# Patient Record
Sex: Male | Born: 1945 | Race: White | Hispanic: No | Smoking: Former smoker
Health system: Southern US, Community
[De-identification: ages and names within clinical notes are randomized; demographics above are authoritative.]

## PROBLEM LIST (undated history)

## (undated) DIAGNOSIS — E782 Mixed hyperlipidemia: Secondary | ICD-10-CM

## (undated) DIAGNOSIS — J449 Chronic obstructive pulmonary disease, unspecified: Secondary | ICD-10-CM

## (undated) DIAGNOSIS — G4733 Obstructive sleep apnea (adult) (pediatric): Secondary | ICD-10-CM

## (undated) DIAGNOSIS — I10 Essential (primary) hypertension: Secondary | ICD-10-CM

## (undated) DIAGNOSIS — R7303 Prediabetes: Secondary | ICD-10-CM

## (undated) DIAGNOSIS — G4730 Sleep apnea, unspecified: Secondary | ICD-10-CM

## (undated) HISTORY — DX: Essential (primary) hypertension: I10

## (undated) HISTORY — DX: Mixed hyperlipidemia: E78.2

## (undated) HISTORY — DX: Chronic obstructive pulmonary disease, unspecified: J44.9

## (undated) HISTORY — DX: Prediabetes: R73.03

## (undated) HISTORY — DX: Obstructive sleep apnea (adult) (pediatric): G47.33

---

## 2000-09-29 HISTORY — PX: OTHER SURGICAL HISTORY: SHX169

## 2002-09-06 ENCOUNTER — Encounter: Payer: Self-pay | Admitting: Orthopedic Surgery

## 2002-09-12 ENCOUNTER — Encounter: Payer: Self-pay | Admitting: Orthopedic Surgery

## 2002-09-12 ENCOUNTER — Inpatient Hospital Stay (HOSPITAL_COMMUNITY): Admission: RE | Admit: 2002-09-12 | Discharge: 2002-09-15 | Payer: Self-pay | Admitting: Orthopedic Surgery

## 2007-12-21 ENCOUNTER — Ambulatory Visit (HOSPITAL_COMMUNITY): Admission: RE | Admit: 2007-12-21 | Discharge: 2007-12-21 | Payer: Self-pay | Admitting: Internal Medicine

## 2008-01-03 ENCOUNTER — Encounter (HOSPITAL_COMMUNITY): Admission: RE | Admit: 2008-01-03 | Discharge: 2008-02-02 | Payer: Self-pay | Admitting: Internal Medicine

## 2008-12-04 ENCOUNTER — Encounter (INDEPENDENT_AMBULATORY_CARE_PROVIDER_SITE_OTHER): Payer: Self-pay | Admitting: *Deleted

## 2010-01-16 ENCOUNTER — Telehealth: Payer: Self-pay | Admitting: Internal Medicine

## 2010-10-29 NOTE — Progress Notes (Signed)
Summary: Schedule Colonoscopy  Phone Note Outgoing Call Call back at [facility] Phone [phone]   Call placed by: Harlow Mares CMA Duncan Dull),  January 16, 2010 3:24 PM Call placed to: Patient Summary of Call: Left message on patients machine to call back. patient is due for his colonoscopy. Initial call taken by: Harlow Mares CMA Duncan Dull),  January 16, 2010 3:24 PM  Follow-up for Phone Call        scheduled for 02/13/2010. Follow-up by: Harlow Mares CMA Duncan Dull),  Jan 28, 2010 10:54 AM

## 2010-12-19 ENCOUNTER — Ambulatory Visit (HOSPITAL_COMMUNITY)
Admission: RE | Admit: 2010-12-19 | Discharge: 2010-12-19 | Disposition: A | Discharge status: Home / Self Care | Payer: 59 | Source: Ambulatory Visit | Attending: Internal Medicine | Admitting: Internal Medicine

## 2010-12-19 ENCOUNTER — Other Ambulatory Visit (INDEPENDENT_AMBULATORY_CARE_PROVIDER_SITE_OTHER): Payer: Self-pay | Admitting: Internal Medicine

## 2010-12-19 ENCOUNTER — Encounter (HOSPITAL_BASED_OUTPATIENT_CLINIC_OR_DEPARTMENT_OTHER): Payer: 59 | Admitting: Internal Medicine

## 2010-12-19 DIAGNOSIS — K921 Melena: Secondary | ICD-10-CM

## 2010-12-19 DIAGNOSIS — K5730 Diverticulosis of large intestine without perforation or abscess without bleeding: Secondary | ICD-10-CM

## 2010-12-19 DIAGNOSIS — K648 Other hemorrhoids: Secondary | ICD-10-CM | POA: Insufficient documentation

## 2010-12-19 DIAGNOSIS — D126 Benign neoplasm of colon, unspecified: Secondary | ICD-10-CM | POA: Insufficient documentation

## 2010-12-19 DIAGNOSIS — K644 Residual hemorrhoidal skin tags: Secondary | ICD-10-CM

## 2010-12-19 DIAGNOSIS — I10 Essential (primary) hypertension: Secondary | ICD-10-CM | POA: Insufficient documentation

## 2010-12-19 DIAGNOSIS — Z79899 Other long term (current) drug therapy: Secondary | ICD-10-CM | POA: Insufficient documentation

## 2010-12-31 NOTE — Op Note (Signed)
  Brendan Ramirez, Brendan Ramirez NO.:  [id number]  MEDICAL RECORD NO.:  [id number]           PATIENT TYPE:  O  LOCATION:  DAYP                          FACILITY:  [facility]  PHYSICIAN:  Lionel December, M.D.    DATE OF BIRTH:  06/14/1946  DATE OF PROCEDURE:  12/19/2010 DATE OF DISCHARGE:                              OPERATIVE REPORT   PROCEDURE:  Colonoscopy.  INDICATION:  Brendan Ramirez is a 65 year old Caucasian male who has been having intermittent hematochezia.  He is undergoing diagnostic colonoscopy. His last exam was possibly 6 years ago in [place].  He decided to have this test locally.  Family history is significant for colon carcinoma in his grandmother but she was 54 at the time of diagnosis. Procedure risks were reviewed with the patient and informed consent was obtained.  MEDICATIONS FOR CONSCIOUS SEDATION:  Demerol 50 mg IV, Versed 4 mg IV.  FINDINGS:  Procedure performed in endoscopy suite.  The patient's vitalsigns and O2 sat were monitored during the procedure and remained stable.  The patient was placed in left lateral recumbent position. Rectal examination performed.  No abnormality noted on external or digital exam.  Pentax videoscope was placed through rectum and advanced under vision into sigmoid colon beyond.  Preparation was excellent. Single small diverticula was noted at distal sigmoid colon.  Scope was passed into cecum which was identified by appendiceal orifice and ileocecal valve.  Pictures were taken for the record.  As the scope was withdrawn, colonic mucosa was carefully examined.  There was 2-mm polyp at descending colon which was ablated via cold biopsy.  Mucosa, rest of the colon was normal.  Rectal mucosa similarly was normal.  Scope was retroflexed to examine anorectal junction.  Small hemorrhoids noted below the dentate line.  Endoscope was then withdrawn.  Withdrawal time was 15 minutes.  The patient tolerated the procedure well.  FINAL  DIAGNOSIS:  Examination performed to cecum.  A 2-mm polyp ablated via cold biopsy from descending colon.  Single diverticulum at sigmoid colon and external hemorrhoids.  RECOMMENDATIONS:  Standard instructions given.  I will be contacting the patient with the results of biopsy and further recommendations.     Lionel December, M.D.     NR/MEDQ  D:  12/19/2010  T:  12/19/2010  Job:  [id number]  cc:   Madelin Rear. Sherwood Gambler, MD Fax: [phone]  Electronically Signed by Lionel December M.D. on 12/31/2010 01:54:58 PM

## 2011-02-14 NOTE — Op Note (Signed)
Brendan Ramirez, Brendan Ramirez NO.:  [id number]   MEDICAL RECORD NO.:  [id number]                   PATIENT TYPE:  INP   LOCATION:  0010                                 FACILITY:  [facility]   PHYSICIAN:  Ollen Gross, M.D.                 DATE OF BIRTH:  Feb 04, 1946   DATE OF PROCEDURE:  09/12/2002  DATE OF DISCHARGE:                                 OPERATIVE REPORT   PREOPERATIVE DIAGNOSES:  Osteoarthritis, left hip.   POSTOPERATIVE DIAGNOSES:  Osteoarthritis, left hip.   PROCEDURE:  Left total hip arthroplasty.   SURGEON:  Ollen Gross, M.D.   ASSISTANT:  Avel Peace, P.A.-C.   ANESTHESIA:  General.   ESTIMATED BLOOD LOSS:  300   DRAINS:  Hemovac x1.   COMPLICATIONS:  None.   CONDITION:  Stable to recovery.   BRIEF CLINICAL NOTE:  Brendan Ramirez is a 65 year old man with severe end-stage  osteoarthritis of the left hip with pain refractory to nonoperative  management. He presents now for left total hip arthroplasty.   DESCRIPTION OF PROCEDURE:  After successful administration of general  anesthetic, the patient was placed in the right lateral decubitus position  with the left side up and held with a hip positioner. The left lower  extremity was isolated from his perineum with plastic drapes and prepped and  draped in the usual sterile fashion. A standard posterolateral incision was  made with a 10 blade through the subcutaneous tissue to the level of the  fascia lata which was incised in line with the skin incision. The sciatic  nerve was palpated and protected and short rotators isolated off the femur.  Capsulectomy was performed and the hip dislocated. The center of the femoral  head was marked and the trial prosthesis placed such that the center of the  trial head corresponds to the center of the native femoral head. Osteotomy  is made down the femoral neck with an oscillating saw and the femoral head  removed. The femur is retracted anteriorly  and acetabular exposure obtained.   The labrum is removed and then osteophytes also removed. Acetabular reaming  started at 51 coursing in increments of 2 to a 57 and then a 58 mm pinnacle  acetabular shell is placed in anatomic position and transfixed with two dome  screws. A trial 32 mm neutral liner is placed.   The femur is addressed first with the canal finder and irrigation. Axial  reaming is performed up to 15.5 mm, proximal reaming up to a 20D and the  sleeve machined to a large. The 20D large trial sleeve is placed with a 20 x  15 stem and a 36 plus 8 neck. We had to drill about 5-10 degrees behind his  native anteversion to get normal version. The 33 plus zero head is placed,  hip reduced with excellent stability, full extension, full external  rotation,  70 degrees flexion, 40 degrees adduction, and 90 degrees internal  rotation and then 90 degrees flexion and 70 degrees internal rotation. The  hip is dislocated and all trials removed. A permanent apex hole eliminator  is placed into the acetabular shell and then a permanent 32 mm neutral  marathon liner is placed. A permanent 20D large sleeve is placed in the  proximally femur and then a 20 x 15 stem with a 36 plus 8 neck placed  matching the same anteversion as the trial. A permanent 32 plus zero head is  placed, hip reduced with the same stability parameters. The wound was  copiously irrigated with antibiotic solution and short rotators reattached  to the femur through drill holes. The fascia lata was closed over a Hemovac  drain with interrupted #1 Vicryl, subcu closed with #1 and 2-0 Vicryl and  subcuticular with running 4-0 Monocryl. The incision was cleaned and dried  and Steri-Strips and a bulky sterile dressing applied. The drain was hooked  to suction. H was placed in a knee immobilizer, awakened and transported to  recovery in stable condition.                                               Ollen Gross, M.D.     FA/MEDQ  D:  09/12/2002  T:  09/12/2002  Job:  [id number]

## 2011-02-14 NOTE — H&P (Signed)
NAMEJAXON, Brendan Ramirez NO.:  [id number]   MEDICAL RECORD NO.:  [id number]                   PATIENT TYPE:  INP   LOCATION:  0462                                 [facility]AC[facility]   PHYSICIAN:  Ollen Gross, M.D.                 DATE OF BIRTH:  [birth year]   DATE OF ADMISSION:  09/12/2002  DATE OF DISCHARGE:  09/15/2002                                HISTORY & PHYSICAL   CHIEF COMPLAINT:  Left hip pain.   HISTORY OF PRESENT ILLNESS:  The patient is a 65 year old male with a four  to five year history of left hip discomfort and pain that has been  progressive over the past several months.  He has been seen and evaluated by  Dr. Lequita Halt.  He states now that he is in constant pain.  It is mainly the  lateral hip with some groin pain and buttock pain.  It started to effect his  gait and ambulation.  He denies any paresthesias into the lower extremities.  He has also started to experience pain at night.  It started to interfere  with his work and also his activities of daily living.  He is seen in the  office where x-rays reveal severe bone-on-bone changes, large osteophyte  formation with marginal subluxation of the hip, and inferior osteophyte  formation at the femoral neck and acetabulum, and essentially bone-on-bone  throughout.  It is felt he would benefit from undergoing a total hip  replacement.  Risks and benefits of the procedure have been discussed with  the patient.  He has elected to proceed with surgery.   ALLERGIES:  SULFA, unknown reaction during his infant years.   MEDICATIONS:  Acetaminophen three tablets t.i.d.   PAST MEDICAL HISTORY:  1. Hemorrhoids.  2. Arthritis.   PAST SURGICAL HISTORY:  Tonsillectomy in 1957.   SOCIAL HISTORY:  Married.  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  Nonsmoker at this time, he quit 18 years ago.  He did  smoke for about four to five years.  No intake of alcohol.  He has  five  children.  He has a two story home with 14 steps.   FAMILY HISTORY:  Mother with hypertension.  Grandmother deceased with a  history of diabetes.  He has a brother with Hodgkin's disease.   REVIEW OF SYMPTOMS:  GENERAL:  No fevers, chills, or night sweats.  NEUROLOGIC:  No seizures, syncope, or paralysis.  RESPIRATORY:  No shortness  of breath, productive cough, or hemoptysis.  CARDIOVASCULAR:  No chest pain,  angina, or orthopnea.  GASTROINTESTINAL:  No nausea, vomiting, diarrhea,  constipation, no blood or mucus in the stool.  GENITOURINARY:  No dysuria,  hematuria, discharge.  MUSCULOSKELETAL:  Pertinent to that of left hip found  in the history of present illness.   PHYSICAL EXAMINATION:  VITAL  SIGNS:  Pulse 60, respirations 12, blood  pressure 148/88.  GENERAL:  The patient is a 65 year old white male, well-developed, well-  nourished, appears to be in no acute distress. slightly overweight.  HEENT:  Normocephalic, atraumatic.  Pupils are round and reactive.  Oropharynx is clear.  NECK:  Supple.  CHEST:  Clear to auscultation anterior and posterior chest walls, no  wheezes, rhonchi, or rales.  HEART:  Regular rate and rhythm, no murmur, S1 and S2 noted.  ABDOMEN:  Soft, nontender, bowel sounds present, slightly round.  RECTAL:  Not done, not pertinent to present illness.  BREASTS:  Not done, not pertinent to present illness.  GENITALIA:  Not done, not pertinent to present illness.  EXTREMITIES:  Significant to that of the left lower extremity.  The left leg  is approximately 1/4 inch shorter as compared to the right leg.  Range of  motion with the left hip approximately 85 to 90 degrees of flexion.  There  is no internal or no external rotation.  Only 20 degrees of abduction.  Motor function is intact.   IMPRESSION:  1. Osteoarthritis of the left hip.  2. Hemorrhoids.   PLAN:  The patient will be admitted to [facility] to undergo a  left total hip  replacement arthroplasty.  Surgery will be performed by Dr.  Ollen Gross.      Alexzandrew L. Julien Girt, P.A.              Ollen Gross, M.D.    ALP/MEDQ  D:  09/15/2002  T:  09/16/2002  Job:  [id number]

## 2011-02-14 NOTE — Discharge Summary (Signed)
NAMEJAHMEIR, Brendan Ramirez NO.:  [id number]   MEDICAL RECORD NO.:  [id number]                   PATIENT TYPE:  INP   LOCATION:  0462                                 FACILITY:  [facility]   PHYSICIAN:  Ollen Gross, M.D.                 DATE OF BIRTH:  October 02, 1945   DATE OF ADMISSION:  09/12/2002  DATE OF DISCHARGE:  09/15/2002                                 DISCHARGE SUMMARY   ADMISSION DIAGNOSES:  1. Osteoarthritis, left hip.  2. Hemorrhoids.  3. Borderline hypertension.   DISCHARGE DIAGNOSES:  1. Osteoarthritis, left hip, status post left total hip replacement with     arthroplasty.  2. Hemorrhoids.  3. Borderline hypertension.   PROCEDURE:  The patient was taken to the OR, September 12, 2002, and  underwent a left total hip replacement with arthroplasty.   SURGEON:  Ollen Gross, M.D.   ASSISTANT:  Alexzandrew L. Julien Girt, P.A.   ANESTHESIA:  General anesthesia.   ESTIMATED BLOOD LOSS:  300 cc.  Hemovac drain x1.   CONSULTATIONS:  Medical, Dr. Wylene Simmer.   BRIEF HISTORY:  The patient is a 65 year old male with a four- to five-year  history of left hip discomfort that has been progressive for the past  several months, evaluated by Dr. Lequita Halt, and is now in constant pain.  X-  rays in the office show severe bone and bone changes with large osteophyte  formation, moderate subluxation of the hip.  It was felt he had reached the  point where he could benefit by undergoing a total hip replacement.  Risks  and benefits discussed.  The patient was subsequently admitted to the  hospital.   LABORATORY DATA:  Hemoglobin 14.6, hematocrit 42.5, white cell count 6.3,  red cell count 4.70, differential all within normal limits.  Postoperative  H&H 12.4 and 35.2.  Last night, H&H 11.2 and 31.7.  PT and PTT on admission  were 12.5 and 31, respectively, with an INR of 0.9.  Serial protimes  followed per Coumadin protocol.  Last noted PT/INR, 18.8 and 1.7.   Chem  panel on admission all within normal limits.  Sodium did have a drop from  141 to 138.  Was last noted to have 133.  CO2 went up from 28 to 33, back  down to 30.  Calcium dropped from 10.0 to 8.3.  The patient did have an  elevated ALP on admission chem panel, 290.  That was the only abnormality.  Urinalysis on September 06, 2002, was negative.  Blood group type A positive.  Preoperative left hip films showed osteoarthritis of the left hip.  Postoperative hip films showed left total hip arthroplasty with __________  and no acute complications.   HOSPITAL COURSE:  The patient was admitted to [facility] and taken  to OR.  Underwent above-stated procedure without complications.  The patient  tolerated the procedure well,  was taken to the recovery room, then to the  orthopedic floor for continued postoperative care.  The patient was given 24  hours of post IV antibiotics plus some PCA analgesics in the form of  morphine.  Hemovac drain placed at the time of surgery was pulled on  postoperative day #1.  The patient's medical doctor is Dr. Wylene Simmer.  Dr.  Wylene Simmer saw the patient on postoperative day #1 and was available for any  medical concerns on the patient throughout the hospital course.  The patient  did well during the course.  PT and OT was consulted to assist with gait  training ambulation.  The patient did well with physical therapy.  He was up  and ambulating approximately 80 feet by postoperative day #2 and by 160 feet  by that evening.  Dressing changes were initiated on postoperative day #2.  The incision was healing well.  PCAs and IVs were discontinued on  postoperative day #2.  He was weaned over the p.o. medications.  He was  having much less pain.  By day #3, the patient was doing quite well.  Had  less pain, tolerating this.  Was discharged home.   DISCHARGE MEDICATIONS/PLAN:  1. Patient discharged home on September 15, 2002.  2. Discharge diagnosis, please see  above.  3. Discharge medications:  Percocet for pain, Robaxin for spasm, Coumadin as     per pharmacy protocol.  4. Diet as tolerated.  5. Activity:  Touchdown weightbearing.  Home health PT and home health     nursing through [facility].  Hip precautions at all times.   FOLLOW UP:  Tuesday, September 27, 2002.  Call for an appointment at [phone]  [phone].   DISPOSITION:  Home.   CONDITION ON DISCHARGE:  Improved.     Alexzandrew L. Julien Girt, P.A.              Ollen Gross, M.D.    ALP/MEDQ  D:  10/12/2002  T:  10/12/2002  Job:  [id number]   cc:   Gaspar Garbe, M.D.  [address]  [place]  [place] [address]  Fax: [phone]

## 2011-02-14 NOTE — Consult Note (Signed)
NAMEMERRIC, YOST NO.:  [id number]   MEDICAL RECORD NO.:  [id number]                   PATIENT TYPE:  INP   LOCATION:  0462                                 [facility]ACILITY[facility]  [facility]   PHYSICIAN:  Gaspar Garbe, M.D.            DATE OF BIRTH:  29-Sep-1946   DATE OF CONSULTATION:  09/13/2002  DATE OF DISCHARGE:                                   CONSULTATION   REASON FOR CONSULTATION:  General medical care status post left hip  replacement.   HISTORY OF PRESENT ILLNESS:  The patient is a 66 year old white male first  seen by me on referral by Dr. Ollen Gross.  On August 30, 2002, he was  referred for preoperative clearance for his upcoming left hip replacement.  At that time the patient had no medical M.D. for approximately 20 years.  His exam was normal.  He was showing some slight borderline hypertension and  slight dyslipidemia with slightly elevated triglycerides, decreased HDL, but  with an excellently controlled LDL of 89 and was found to be at low risk.  The patient has had his left hip replacement on September 12, 2002.  He is  currently on morphine PCA and complains of just some mild pain for which he  is using the demand button.  Otherwise, does not note any acute changes  since the time of his surgery.  He indicated that he is particularly hungry  and ate well last night.   PAST MEDICAL HISTORY:  Osteoarthritis left hip.   PAST SURGICAL HISTORY:  As above, plus tonsillectomy in 1956.   SOCIAL HISTORY:  The patient is married, with five children.  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  Has a 30-pack-year smoking history and quit in  1985.  He is a nondrinker.   FAMILY HISTORY:  Father died, age 17, of Parkinson's, with ulcer history.  His mother is alive, with hypertension, allergies, cataracts.  He has a  maternal grandfather who has died of lung cancer, and a paternal grandfather  who died of a brain abscess.  He has one brother  who died of Hodgkin's at  age 84.  All five of his children are healthy.   ALLERGIES:  SULFA allergy as a child.  VIOXX causing depigmentation.   REVIEW OF SYSTEMS:  The patient does have some mild tenderness of his left  hip but is not in any acute distress.  Review of systems is otherwise  negative.   PHYSICAL EXAMINATION:  VITAL SIGNS:  Temperature 100.1, blood pressure  138/76, heart rate 68.  Pain is 2 on a 0-10 scale currently.  GENERAL:  In no acute distress.  HEENT:  PERRLA.  EOMI.  ENT is within normal limits.  NECK:  Supple.  No lymphadenopathy or JVD.  LUNGS:  Clear to auscultation bilaterally.  HEART:  Regular rate and rhythm.  No murmur, rub,  or gallop.  ABDOMEN:  Soft and nontender.  Normoactive bowel sounds.  EXTREMITIES:  Upper extremities are within normal limits.  Left extremity is  wrapped with a drain in place.  He has strong pulses, dorsalis pedis  bilaterally, and normal range of motion in his ankles and toes.    ASSESSMENT AND PLAN:  1. General medical follow-up:  The patient has a low-grade fever.  Will     continue to watch his fever curve since he is status post orthopedic     surgery.  May be partially secondary to atelectasis versus the procedure     itself.  The wound appeared to be in good condition per Dr. Deri Fuelling     note earlier today, and he received irrigation with antibiotics at that     time.  Prefer to just follow him at this point.  If he continues to have     fevers, will do blood cultures, urine culture, as well as a chest x-ray.  2. Borderline hypertension:  Continue to follow in the hospital.  3. As there is not a specific complaint of record as of today, I will follow     the patient remotely and provide input as needed during the time of his     hospital course.  Per the patient, he is due to go home most likely on     Friday, with physical therapy follow-up thereafter.                                               Gaspar Garbe, M.D.    RWT/MEDQ  D:  09/13/2002  T:  09/13/2002  Job:  [id number]   cc:   Ollen Gross, M.D.  [address]  [place]  [place] [address]  Fax: [phone]

## 2015-04-27 ENCOUNTER — Other Ambulatory Visit (INDEPENDENT_AMBULATORY_CARE_PROVIDER_SITE_OTHER): Payer: Self-pay | Admitting: Otolaryngology

## 2015-04-27 DIAGNOSIS — H918X2 Other specified hearing loss, left ear: Secondary | ICD-10-CM

## 2015-04-27 DIAGNOSIS — IMO0001 Reserved for inherently not codable concepts without codable children: Secondary | ICD-10-CM

## 2015-05-09 ENCOUNTER — Ambulatory Visit (HOSPITAL_COMMUNITY)
Admission: RE | Admit: 2015-05-09 | Discharge: 2015-05-09 | Disposition: A | Discharge status: Home / Self Care | Payer: 59 | Source: Ambulatory Visit | Attending: Otolaryngology | Admitting: Otolaryngology

## 2015-05-09 DIAGNOSIS — H9192 Unspecified hearing loss, left ear: Secondary | ICD-10-CM | POA: Insufficient documentation

## 2015-05-09 DIAGNOSIS — H918X2 Other specified hearing loss, left ear: Secondary | ICD-10-CM

## 2015-05-09 DIAGNOSIS — IMO0001 Reserved for inherently not codable concepts without codable children: Secondary | ICD-10-CM

## 2015-05-09 LAB — POCT I-STAT CREATININE: Creatinine, Ser: 1.1 mg/dL (ref 0.61–1.24)

## 2015-05-09 MED ORDER — SODIUM CHLORIDE 0.9 % IJ SOLN
INTRAMUSCULAR | Status: AC
  Filled 2015-05-09: qty 15

## 2015-05-09 MED ORDER — GADOBENATE DIMEGLUMINE 529 MG/ML IV SOLN
20.0000 mL | Freq: Once | INTRAVENOUS | Status: AC | PRN
  Administered 2015-05-09: 20 mL via INTRAVENOUS

## 2016-05-26 ENCOUNTER — Ambulatory Visit (INDEPENDENT_AMBULATORY_CARE_PROVIDER_SITE_OTHER): Payer: 59 | Admitting: Otolaryngology

## 2016-05-26 DIAGNOSIS — H903 Sensorineural hearing loss, bilateral: Secondary | ICD-10-CM | POA: Diagnosis not present

## 2016-05-26 DIAGNOSIS — H6123 Impacted cerumen, bilateral: Secondary | ICD-10-CM

## 2016-08-18 ENCOUNTER — Other Ambulatory Visit (HOSPITAL_COMMUNITY): Payer: Self-pay | Admitting: Internal Medicine

## 2016-08-18 DIAGNOSIS — M130 Polyarthritis, unspecified: Secondary | ICD-10-CM

## 2016-08-26 ENCOUNTER — Inpatient Hospital Stay (HOSPITAL_COMMUNITY): Admission: RE | Admit: 2016-08-26 | Payer: 59 | Source: Ambulatory Visit

## 2017-05-21 ENCOUNTER — Ambulatory Visit (INDEPENDENT_AMBULATORY_CARE_PROVIDER_SITE_OTHER): Payer: 59 | Admitting: Otolaryngology

## 2017-05-21 DIAGNOSIS — H903 Sensorineural hearing loss, bilateral: Secondary | ICD-10-CM

## 2017-05-21 DIAGNOSIS — J310 Chronic rhinitis: Secondary | ICD-10-CM | POA: Diagnosis not present

## 2017-10-14 ENCOUNTER — Encounter: Payer: Self-pay | Admitting: Cardiology

## 2017-10-14 ENCOUNTER — Encounter: Payer: Self-pay | Admitting: *Deleted

## 2017-10-14 NOTE — Progress Notes (Signed)
Cardiology Office Note  Date: 10/15/2017   ID: [name], DOB 1946/08/21, MRN [id number]  PCP: Celene Squibb, MD  Consulting Cardiologist: Rozann Lesches, MD   Chief Complaint  Patient presents with  . Bradycardia    History of Present Illness: Brendan Ramirez is a 72 y.o. male referred for cardiology consultation by Dr. [place] Crane for the evaluation of bradycardia. He tells me that he has had a slow heart rate for several years. This in and of itself has not bothered him, but he has been concerned more recently about dyspnea on exertion. He does not report exertional chest pain, no progressive palpitations, no lightheadedness or syncope. He feels like he is able to do the things that he needs to do, but he has to pace himself as opposed to years ago. He previously delivered the mail for 30 years.  There are no recent ECGs available for review. Heart rate listed at 57 bpm office visit with Dr. [place] Crane from December 2018. I personally reviewed his tracing from today which shows sinus bradycardia at 47 bpm with lead artifact.  He does not report any personal history of thyroid disease. He is not on any heart rate lowering medications. No history of tick born illness.  Past Medical History:  Diagnosis Date  . Essential hypertension     History reviewed. No pertinent surgical history.  Current Outpatient Medications  Medication Sig Dispense Refill  . aspirin EC 81 MG tablet Take 81 mg by mouth daily.    . fluticasone (FLONASE) 50 MCG/ACT nasal spray Place 1 spray into both nostrils daily.    Brendan Ramirez Kitchen levocetirizine (XYZAL) 5 MG tablet Take 5 mg by mouth daily.    . Multiple Vitamin (MULTIVITAMIN) tablet Take 1 tablet by mouth daily.    . naproxen sodium (ALEVE) 220 MG tablet Take 220 mg by mouth.    . olmesartan (BENICAR) 40 MG tablet Take 40 mg by mouth daily.     No current facility-administered medications for this visit.    Allergies:  Sulfa antibiotics   Social History: The patient   reports that he has quit smoking. His smoking use included cigarettes. He started smoking about 33 years ago. he has never used smokeless tobacco. He reports that he drinks alcohol. He reports that he does not use drugs.   Family History: The patient's family history is not on file.   ROS:  Please see the history of present illness. Otherwise, complete review of systems is positive for arthritic stiffness, cold feeling in his hands and feet.  All other systems are reviewed and negative.   Physical Exam: VS:  BP 132/68   Pulse (!) 49   Ht 5\' 11"  (1.803 m)   Wt 289 lb (131.1 kg)   SpO2 96%   BMI 40.31 kg/m , BMI Body mass index is 40.31 kg/m.  Wt Readings from Last 3 Encounters:  10/15/17 289 lb (131.1 kg)  10/03/16 276 lb (125.2 kg)  05/09/15 270 lb (122.5 kg)    General: Obese male, appears comfortable at rest. HEENT: Conjunctiva and lids normal, oropharynx clear. Neck: Supple, no elevated JVP or carotid bruits, no thyromegaly. Lungs: Clear to auscultation, nonlabored breathing at rest. Cardiac: Regular rate and rhythm, no S3 or significant systolic murmur, no pericardial rub. Abdomen: Soft, nontender, bowel sounds present, no guarding or rebound. Extremities: No pitting edema, distal pulses 2+. Skin: Warm and dry. Musculoskeletal: No kyphosis. Neuropsychiatric: Alert and oriented x3, affect grossly appropriate.  ECG: No  old tracing available for review today.  Recent Labwork:  November 2018: Hemoglobin 13.6, platelets 224, BUN 18, creatinine 1.09, potassium 4.5, AST 28, ALT 24, cholesterol 146, triglycerides 163, HDL 26, LDL 87  Assessment and Plan:  1. Sinus bradycardia. Not clear that this is symptom provoking at this time. He reports no syncope. We will obtain a TSH level for general thyroid screening. Also GXT to assess for any potential chronotropic incompetence. This will also give [place] a general screening for ischemic heart disease which was also a concern that he  had.  2. Dyspnea on exertion. GXT being ordered as discussed above. We will also obtain an echocardiogram for cardiac structural assessment.  3. Essential hypertension, on Benicar.  Current medicines were reviewed with the patient today.   Orders Placed This Encounter  Procedures  . TSH  . Exercise Tolerance Test  . EKG 12-Lead  . ECHOCARDIOGRAM COMPLETE    Disposition: Call with test results.  Signed, Satira Sark, MD, [facility] 10/15/2017 1:54 PM    [facility] [address], [place], [place] [address] Phone: [phone]; Fax: [phone]

## 2017-10-15 ENCOUNTER — Telehealth: Payer: Self-pay | Admitting: Cardiology

## 2017-10-15 ENCOUNTER — Ambulatory Visit (INDEPENDENT_AMBULATORY_CARE_PROVIDER_SITE_OTHER): Payer: 59 | Admitting: Cardiology

## 2017-10-15 ENCOUNTER — Other Ambulatory Visit: Payer: Self-pay | Admitting: Cardiology

## 2017-10-15 ENCOUNTER — Encounter: Payer: Self-pay | Admitting: Cardiology

## 2017-10-15 VITALS — BP 132/68 | HR 49 | Ht 71.0 in | Wt 289.0 lb

## 2017-10-15 DIAGNOSIS — I10 Essential (primary) hypertension: Secondary | ICD-10-CM

## 2017-10-15 DIAGNOSIS — R0609 Other forms of dyspnea: Secondary | ICD-10-CM

## 2017-10-15 DIAGNOSIS — R001 Bradycardia, unspecified: Secondary | ICD-10-CM | POA: Diagnosis not present

## 2017-10-15 DIAGNOSIS — R0602 Shortness of breath: Secondary | ICD-10-CM | POA: Diagnosis not present

## 2017-10-15 LAB — TSH: TSH: 1.66 mIU/L (ref 0.40–4.50)

## 2017-10-15 NOTE — Telephone Encounter (Signed)
Both test scheduled at [facility] Oct 23, 2017 arrive atr 8:15am  GXT- dyspnea on exertion, shortness of breath  Echo- shortness of breath

## 2017-10-15 NOTE — Patient Instructions (Signed)
Medication Instructions:  Your physician recommends that you continue on your current medications as directed. Please refer to the Current Medication list given to you today.  Labwork: TSH Orders given today  Testing/Procedures: Your physician has requested that you have an exercise tolerance test. For further information please visit [email or web address]. Please also follow instruction sheet, as given.  Your physician has requested that you have an echocardiogram. Echocardiography is a painless test that uses sound waves to create images of your heart. It provides your doctor with information about the size and shape of your heart and how well your heart's chambers and valves are working. This procedure takes approximately one hour. There are no restrictions for this procedure.   Follow-Up: Your physician recommends that you schedule a follow-up appointment in: PENDING TEST RESULTS   Any Other Special Instructions Will Be Listed Below (If Applicable).  If you need a refill on your cardiac medications before your next appointment, please call your pharmacy.

## 2017-10-16 ENCOUNTER — Telehealth: Payer: Self-pay

## 2017-10-16 ENCOUNTER — Other Ambulatory Visit (HOSPITAL_COMMUNITY): Payer: 59

## 2017-10-16 NOTE — Telephone Encounter (Signed)
Patient notified. Routed to PCP 

## 2017-10-16 NOTE — Telephone Encounter (Signed)
-----   Message from Merlene Laughter, LPN sent at 9/53/2023 10:07 AM EST -----   ----- Message ----- From: Satira Sark, MD Sent: 10/16/2017   9:57 AM To: Merlene Laughter, LPN  Results reviewed. TSH normal suggesting normal thyroid function. A copy of this test should be forwarded to Celene Squibb, MD.

## 2017-10-16 NOTE — Telephone Encounter (Signed)
LMTCB

## 2017-10-23 ENCOUNTER — Telehealth: Payer: Self-pay

## 2017-10-23 ENCOUNTER — Ambulatory Visit (HOSPITAL_COMMUNITY)
Admission: RE | Admit: 2017-10-23 | Discharge: 2017-10-23 | Disposition: A | Discharge status: Home / Self Care | Payer: 59 | Source: Ambulatory Visit | Attending: Cardiology | Admitting: Cardiology

## 2017-10-23 DIAGNOSIS — R0609 Other forms of dyspnea: Secondary | ICD-10-CM | POA: Insufficient documentation

## 2017-10-23 DIAGNOSIS — R0602 Shortness of breath: Secondary | ICD-10-CM | POA: Diagnosis not present

## 2017-10-23 DIAGNOSIS — R001 Bradycardia, unspecified: Secondary | ICD-10-CM | POA: Insufficient documentation

## 2017-10-23 DIAGNOSIS — I10 Essential (primary) hypertension: Secondary | ICD-10-CM | POA: Insufficient documentation

## 2017-10-23 DIAGNOSIS — I351 Nonrheumatic aortic (valve) insufficiency: Secondary | ICD-10-CM | POA: Insufficient documentation

## 2017-10-23 LAB — ECHOCARDIOGRAM COMPLETE
CHL CUP DOP CALC LVOT VTI: 30.6 cm
CHL CUP STROKE VOLUME: 84 mL
E decel time: 303 msec
E/e' ratio: 10.6
FS: 33 % (ref 28–44)
IVS/LV PW RATIO, ED: 1
LA diam end sys: 38 mm
LA diam index: 1.45 cm/m2
LA vol A4C: 56.2 ml
LASIZE: 38 mm
LAVOL: 70.2 mL
LAVOLIN: 26.8 mL/m2
LV E/e' medial: 10.6
LV E/e'average: 10.6
LV PW d: 12.5 mm — AB (ref 0.6–1.1)
LV SIMPSON'S DISK: 61
LV dias vol index: 52 mL/m2
LV sys vol: 53 mL (ref 21–61)
LVDIAVOL: 137 mL (ref 62–150)
LVELAT: 10 cm/s
LVOT area: 3.46 cm2
LVOT peak grad rest: 8 mmHg
LVOT peak vel: 145 cm/s
LVOTD: 21 mm
LVOTSV: 106 mL
LVSYSVOLIN: 20 mL/m2
Lateral S' vel: 17.4 cm/s
MV Dec: 303
MV Peak grad: 4 mmHg
MV pk A vel: 121 m/s
MVPKEVEL: 106 m/s
RV sys press: 22 mmHg
Reg peak vel: 220 cm/s
TAPSE: 25.7 mm
TDI e' lateral: 10
TDI e' medial: 6.2
TR max vel: 220 cm/s

## 2017-10-23 LAB — EXERCISE TOLERANCE TEST
CSEPED: 5 min
CSEPEW: 7 METS
CSEPPHR: 136 {beats}/min
Exercise duration (sec): 0 s
MPHR: 149 {beats}/min
Percent HR: 91 %
RPE: 16
Rest HR: 45 {beats}/min

## 2017-10-23 NOTE — Telephone Encounter (Signed)
-----   Message from Merlene Laughter, LPN sent at 1/36/4383 12:05 PM EST -----   ----- Message ----- From: Satira Sark, MD Sent: 10/23/2017  11:35 AM To: Merlene Laughter, LPN  Results reviewed. LVEF is normal at 55-60%. No major valvular abnormalities to explain symptoms. We will follow-up on GXT. A copy of this test should be forwarded to Celene Squibb, MD.

## 2017-10-23 NOTE — Telephone Encounter (Signed)
-----   Message from Merlene Laughter, LPN sent at 05/28/9406  3:39 PM EST -----   ----- Message ----- From: Satira Sark, MD Sent: 10/23/2017   2:59 PM To: Merlene Laughter, LPN  Results reviewed. Please let him know that the stress test was reassuring. Both from the perspective of low risk for ischemic heart disease, and due to the fact that he achieve an adequate heart rate response. Would not anticipate any further cardiac workup for bradycardia at this time. A copy of this test should be forwarded to Celene Squibb, MD.

## 2017-10-23 NOTE — Progress Notes (Signed)
*  PRELIMINARY RESULTS* Echocardiogram 2D Echocardiogram has been performed.  Brendan Ramirez 10/23/2017, 10:18 AM

## 2017-10-23 NOTE — Telephone Encounter (Signed)
Patient notified. Routed to PCP 

## 2018-05-20 ENCOUNTER — Ambulatory Visit (INDEPENDENT_AMBULATORY_CARE_PROVIDER_SITE_OTHER): Payer: 59 | Admitting: Otolaryngology

## 2018-05-20 DIAGNOSIS — H903 Sensorineural hearing loss, bilateral: Secondary | ICD-10-CM | POA: Diagnosis not present

## 2018-09-23 ENCOUNTER — Encounter (INDEPENDENT_AMBULATORY_CARE_PROVIDER_SITE_OTHER): Payer: Self-pay | Admitting: *Deleted

## 2018-11-10 ENCOUNTER — Other Ambulatory Visit (INDEPENDENT_AMBULATORY_CARE_PROVIDER_SITE_OTHER): Payer: Self-pay | Admitting: *Deleted

## 2018-11-10 DIAGNOSIS — Z800 Family history of malignant neoplasm of digestive organs: Secondary | ICD-10-CM

## 2018-11-10 DIAGNOSIS — Z860100 Personal history of colon polyps, unspecified: Secondary | ICD-10-CM | POA: Insufficient documentation

## 2019-02-01 ENCOUNTER — Telehealth (INDEPENDENT_AMBULATORY_CARE_PROVIDER_SITE_OTHER): Payer: Self-pay | Admitting: *Deleted

## 2019-02-01 ENCOUNTER — Encounter (INDEPENDENT_AMBULATORY_CARE_PROVIDER_SITE_OTHER): Payer: Self-pay | Admitting: *Deleted

## 2019-02-01 NOTE — Telephone Encounter (Signed)
Patient needs suprep 

## 2019-02-02 MED ORDER — SUPREP BOWEL PREP KIT 17.5-3.13-1.6 GM/177ML PO SOLN: 1.0000 | Freq: Once | ORAL | 0 refills | Status: AC

## 2019-02-15 ENCOUNTER — Telehealth (INDEPENDENT_AMBULATORY_CARE_PROVIDER_SITE_OTHER): Payer: Self-pay | Admitting: *Deleted

## 2019-02-15 NOTE — Telephone Encounter (Signed)
Referring MD/PCP: hall   Procedure: tcs  Reason/Indication:  Hx polyps, fam hx colon ca  Has patient had this procedure before?  Yes, 2012  If so, when, by whom and where?    Is there a family history of colon cancer?  Yes, grandmother  Who?  What age when diagnosed?    Is patient diabetic?   no      Does patient have prosthetic heart valve or mechanical valve?  no  Do you have a pacemaker?  no  Has patient ever had endocarditis? no  Has patient had joint replacement within last 12 months?  no  Is patient constipated or do they take laxatives? no  Does patient have a history of alcohol/drug use?  no  Is patient on blood thinner such as Coumadin, Plavix and/or Aspirin? no  Medications: olmesartan 40 mg daily, Flonase daily, xyzal 5 mg daily, centrum silver fish oil bid  Allergies: sulfa  Medication Adjustment per Dr Lindi Adie, NP:   Procedure date & time: 03/03/19 at 930

## 2019-02-15 NOTE — Telephone Encounter (Signed)
agree

## 2019-02-17 ENCOUNTER — Encounter (INDEPENDENT_AMBULATORY_CARE_PROVIDER_SITE_OTHER): Payer: Self-pay | Admitting: *Deleted

## 2019-03-03 DIAGNOSIS — Z800 Family history of malignant neoplasm of digestive organs: Secondary | ICD-10-CM

## 2019-03-03 DIAGNOSIS — Z86010 Personal history of colonic polyps: Principal | ICD-10-CM

## 2019-05-09 ENCOUNTER — Encounter (INDEPENDENT_AMBULATORY_CARE_PROVIDER_SITE_OTHER): Payer: Self-pay | Admitting: *Deleted

## 2019-05-16 ENCOUNTER — Ambulatory Visit (INDEPENDENT_AMBULATORY_CARE_PROVIDER_SITE_OTHER): Payer: 59 | Admitting: Otolaryngology

## 2019-05-16 DIAGNOSIS — H903 Sensorineural hearing loss, bilateral: Secondary | ICD-10-CM | POA: Diagnosis not present

## 2019-05-23 ENCOUNTER — Telehealth (INDEPENDENT_AMBULATORY_CARE_PROVIDER_SITE_OTHER): Payer: Self-pay | Admitting: *Deleted

## 2019-05-23 NOTE — Telephone Encounter (Signed)
Referring MD/PCP: hall   Procedure: tcs  Reason/Indication:  Hx polyps, fam hx colon ca  Has patient had this procedure before?  Yes, 2012             If so, when, by whom and where?    Is there a family history of colon cancer?  Yes, grandmother             Who?  What age when diagnosed?    Is patient diabetic?   no                                                  Does patient have prosthetic heart valve or mechanical valve?  no  Do you have a pacemaker?  no  Has patient ever had endocarditis? no  Has patient had joint replacement within last 12 months?  no  Is patient constipated or do they take laxatives? no  Does patient have a history of alcohol/drug use?  no  Is patient on blood thinner such as Coumadin, Plavix and/or Aspirin? no  Medications: olmesartan 40 mg daily, Flonase daily, xyzal 5 mg daily, centrum silver fish oil bid  Allergies: sulfa  Medication Adjustment per Dr Lindi Adie, NP:   Procedure date & time: 06/23/19 at 830

## 2019-05-24 ENCOUNTER — Other Ambulatory Visit: Payer: Self-pay

## 2019-05-24 ENCOUNTER — Ambulatory Visit (INDEPENDENT_AMBULATORY_CARE_PROVIDER_SITE_OTHER): Payer: Self-pay

## 2019-05-25 NOTE — Telephone Encounter (Signed)
Okay to schedule colonoscopy with conscious sedation 

## 2019-06-21 ENCOUNTER — Other Ambulatory Visit (HOSPITAL_COMMUNITY)
Admission: RE | Admit: 2019-06-21 | Discharge: 2019-06-21 | Disposition: A | Discharge status: Home / Self Care | Payer: 59 | Source: Ambulatory Visit | Attending: Internal Medicine | Admitting: Internal Medicine

## 2019-06-21 DIAGNOSIS — Z20828 Contact with and (suspected) exposure to other viral communicable diseases: Secondary | ICD-10-CM | POA: Insufficient documentation

## 2019-06-21 DIAGNOSIS — Z01812 Encounter for preprocedural laboratory examination: Secondary | ICD-10-CM | POA: Diagnosis not present

## 2019-06-21 LAB — SARS CORONAVIRUS 2 (TAT 6-24 HRS): SARS Coronavirus 2: NEGATIVE

## 2019-06-23 ENCOUNTER — Other Ambulatory Visit: Payer: Self-pay

## 2019-06-23 ENCOUNTER — Ambulatory Visit (HOSPITAL_COMMUNITY)
Admission: RE | Admit: 2019-06-23 | Discharge: 2019-06-23 | Disposition: A | Discharge status: Home / Self Care | Payer: 59 | Attending: Internal Medicine | Admitting: Internal Medicine

## 2019-06-23 ENCOUNTER — Encounter (HOSPITAL_COMMUNITY): Payer: Self-pay | Admitting: *Deleted

## 2019-06-23 ENCOUNTER — Encounter (HOSPITAL_COMMUNITY)
Admission: RE | Disposition: A | Discharge status: Home / Self Care | Payer: Self-pay | Source: Home / Self Care | Attending: Internal Medicine

## 2019-06-23 DIAGNOSIS — Z882 Allergy status to sulfonamides status: Secondary | ICD-10-CM | POA: Insufficient documentation

## 2019-06-23 DIAGNOSIS — Z1211 Encounter for screening for malignant neoplasm of colon: Secondary | ICD-10-CM | POA: Insufficient documentation

## 2019-06-23 DIAGNOSIS — I10 Essential (primary) hypertension: Secondary | ICD-10-CM | POA: Insufficient documentation

## 2019-06-23 DIAGNOSIS — Z860100 Personal history of colon polyps, unspecified: Secondary | ICD-10-CM | POA: Insufficient documentation

## 2019-06-23 DIAGNOSIS — K644 Residual hemorrhoidal skin tags: Secondary | ICD-10-CM | POA: Insufficient documentation

## 2019-06-23 DIAGNOSIS — Z79899 Other long term (current) drug therapy: Secondary | ICD-10-CM | POA: Diagnosis not present

## 2019-06-23 DIAGNOSIS — Z87891 Personal history of nicotine dependence: Secondary | ICD-10-CM | POA: Insufficient documentation

## 2019-06-23 DIAGNOSIS — G4730 Sleep apnea, unspecified: Secondary | ICD-10-CM | POA: Diagnosis not present

## 2019-06-23 DIAGNOSIS — Z800 Family history of malignant neoplasm of digestive organs: Secondary | ICD-10-CM | POA: Diagnosis not present

## 2019-06-23 DIAGNOSIS — D123 Benign neoplasm of transverse colon: Secondary | ICD-10-CM | POA: Diagnosis not present

## 2019-06-23 DIAGNOSIS — Z9660 Presence of unspecified orthopedic joint implant: Secondary | ICD-10-CM | POA: Insufficient documentation

## 2019-06-23 DIAGNOSIS — D125 Benign neoplasm of sigmoid colon: Secondary | ICD-10-CM | POA: Insufficient documentation

## 2019-06-23 DIAGNOSIS — Z09 Encounter for follow-up examination after completed treatment for conditions other than malignant neoplasm: Secondary | ICD-10-CM | POA: Diagnosis not present

## 2019-06-23 HISTORY — PX: COLONOSCOPY: SHX5424

## 2019-06-23 HISTORY — DX: Sleep apnea, unspecified: G47.30

## 2019-06-23 HISTORY — PX: POLYPECTOMY: SHX5525

## 2019-06-23 SURGERY — COLONOSCOPY
Anesthesia: Moderate Sedation

## 2019-06-23 MED ORDER — MIDAZOLAM HCL 5 MG/5ML IJ SOLN
INTRAMUSCULAR | Status: AC
  Filled 2019-06-23: qty 10

## 2019-06-23 MED ORDER — MIDAZOLAM HCL 5 MG/5ML IJ SOLN
INTRAMUSCULAR | Status: DC | PRN
  Administered 2019-06-23 (×3): 1 mg via INTRAVENOUS
  Administered 2019-06-23 (×2): 2 mg via INTRAVENOUS

## 2019-06-23 MED ORDER — STERILE WATER FOR IRRIGATION IR SOLN
Status: DC | PRN
  Administered 2019-06-23: 2.5 mL

## 2019-06-23 MED ORDER — MEPERIDINE HCL 50 MG/ML IJ SOLN
INTRAMUSCULAR | Status: DC | PRN
  Administered 2019-06-23 (×2): 25 mg via INTRAVENOUS

## 2019-06-23 MED ORDER — MEPERIDINE HCL 50 MG/ML IJ SOLN
INTRAMUSCULAR | Status: AC
  Filled 2019-06-23: qty 1

## 2019-06-23 MED ORDER — SODIUM CHLORIDE 0.9 % IV SOLN
INTRAVENOUS | Status: DC
  Administered 2019-06-23: 08:00:00 via INTRAVENOUS

## 2019-06-23 NOTE — H&P (Signed)
Brendan Ramirez is an 73 y.o. male.   Chief Complaint: Patient is here for colonoscopy. HPI: Patient is 73 year old Caucasian male with history of colonic polyp and is here for surveillance colonoscopy.  Last exam was about 8 years ago.  Only had one polyp and he was advised to return in 7 years.  He denies abdominal pain or change in bowel habits.  Family history significant for colon carcinoma in maternal grandfather who was in his 57s at the time of diagnosis. Last dose of Aleve was 2 days ago.  Past Medical History:  Diagnosis Date  . Essential hypertension   . Sleep apnea     Past Surgical History:  Procedure Laterality Date  . Left total arthroplasty  2002    Family History  Problem Relation Age of Onset  . Colon cancer Other    Social History:  reports that he has quit smoking. His smoking use included cigarettes. He started smoking about 35 years ago. He has never used smokeless tobacco. He reports current alcohol use. He reports that he does not use drugs.  Allergies:  Allergies  Allergen Reactions  . Sulfa Antibiotics     As a baby     Medications Prior to Admission  Medication Sig Dispense Refill  . Coenzyme Q10 (CO Q-10) 200 MG CAPS Take 200 mg by mouth daily.    . fluticasone (FLONASE) 50 MCG/ACT nasal spray Place 1 spray into both nostrils daily.    Marland Kitchen levocetirizine (XYZAL) 5 MG tablet Take 5 mg by mouth daily.    . Multiple Vitamin (MULTIVITAMIN) tablet Take 1 tablet by mouth daily.    . naproxen sodium (ALEVE) 220 MG tablet Take 220 mg by mouth daily as needed (pain).     Marland Kitchen olmesartan (BENICAR) 40 MG tablet Take 40 mg by mouth daily.    . Omega-3 Fatty Acids (FISH OIL) 1200 MG CAPS Take 1,200 mg by mouth 2 (two) times daily.      No results found for this or any previous visit (from the past 48 hour(s)). No results found.  ROS  Blood pressure (!) 155/69, pulse (!) 58, temperature 99.2 F (37.3 C), temperature source Oral, resp. rate 18, height 5' 10.5"  (1.791 m), weight 131.5 kg, SpO2 96 %. Physical Exam  Constitutional: He appears well-developed and well-nourished.  HENT:  Mouth/Throat: Oropharynx is clear and moist.  Eyes: Conjunctivae are normal. No scleral icterus.  Neck: No thyromegaly present.  Cardiovascular: Normal rate and regular rhythm.  Murmur heard. Faint systolic murmur noted at aortic area.  Respiratory: Effort normal and breath sounds normal.  GI:  Abdomen is full but soft and nontender with organomegaly or masses.  Musculoskeletal:        General: No edema.  Lymphadenopathy:    He has no cervical adenopathy.  Neurological: He is alert.  Skin: Skin is warm and dry.     Assessment/Plan History of colonic polyp. Family history of CRC in second-degree relative. Surveillance colonoscopy.  Hildred Laser, MD 06/23/2019, 8:45 AM

## 2019-06-23 NOTE — Discharge Instructions (Signed)
No aspirin or NSAIDs for 24 hours. Resume other medications as before. Resume usual diet. No driving for 24 hours. Physician will call with biopsy results.   Colonoscopy, Adult, Care After This sheet gives you information about how to care for yourself after your procedure. Your doctor may also give you more specific instructions. If you have problems or questions, call your doctor. What can I expect after the procedure? After the procedure, it is common to have:  A small amount of blood in your poop for 24 hours.  Some gas.  Mild cramping or bloating in your belly. Follow these instructions at home: General instructions  For the first 24 hours after the procedure: ? Do not drive or use machinery. ? Do not sign important documents. ? Do not drink alcohol. ? Do your daily activities more slowly than normal. ? Eat foods that are soft and easy to digest.  Take over-the-counter or prescription medicines only as told by your doctor. To help cramping and bloating:   Try walking around.  Put heat on your belly (abdomen) as told by your doctor. Use a heat source that your doctor recommends, such as a moist heat pack or a heating pad. ? Put a towel between your skin and the heat source. ? Leave the heat on for 20-30 minutes. ? Remove the heat if your skin turns bright red. This is especially important if you cannot feel pain, heat, or cold. You can get burned. Eating and drinking   Drink enough fluid to keep your pee (urine) clear or pale yellow.  Return to your normal diet as told by your doctor. Avoid heavy or fried foods that are hard to digest.  Avoid drinking alcohol for as long as told by your doctor. Contact a doctor if:  You have blood in your poop (stool) 2-3 days after the procedure. Get help right away if:  You have more than a small amount of blood in your poop.  You see large clumps of tissue (blood clots) in your poop.  Your belly is swollen.  You feel  sick to your stomach (nauseous).  You throw up (vomit).  You have a fever.  You have belly pain that gets worse, and medicine does not help your pain. Summary  After the procedure, it is common to have a small amount of blood in your poop. You may also have mild cramping and bloating in your belly.  For the first 24 hours after the procedure, do not drive or use machinery, do not sign important documents, and do not drink alcohol.  Get help right away if you have a lot of blood in your poop, feel sick to your stomach, have a fever, or have more belly pain. This information is not intended to replace advice given to you by your health care provider. Make sure you discuss any questions you have with your health care provider. Document Released: 10/18/2010 Document Revised: 07/16/2017 Document Reviewed: 06/09/2016 Elsevier Patient Education  2020 [employer].  Colon Polyps  Polyps are tissue growths inside the body. Polyps can grow in many places, including the large intestine (colon). A polyp may be a round bump or a mushroom-shaped growth. You could have one polyp or several. Most colon polyps are noncancerous (benign). However, some colon polyps can become cancerous over time. Finding and removing the polyps early can help prevent this. What are the causes? The exact cause of colon polyps is not known. What increases the risk? You are more  likely to develop this condition if you: °· Have a family history of colon cancer or colon polyps. °· Are older than 50 or older than 45 if you are African American. °· Have inflammatory bowel disease, such as ulcerative colitis or Crohn's disease. °· Have certain hereditary conditions, such as: °? Familial adenomatous polyposis. °? Lynch syndrome. °? Turcot syndrome. °? Peutz-Jeghers syndrome. °· Are overweight. °· Smoke cigarettes. °· Do not get enough exercise. °· Drink too much alcohol. °· Eat a diet that is high in fat and red meat and low in  fiber. °· Had childhood cancer that was treated with abdominal radiation. °What are the signs or symptoms? °Most polyps do not cause symptoms. °If you have symptoms, they may include: °· Blood coming from your rectum when having a bowel movement. °· Blood in your stool. The stool may look dark red or black. °· Abdominal pain. °· A change in bowel habits, such as constipation or diarrhea. °How is this diagnosed? °This condition is diagnosed with a colonoscopy. This is a procedure in which a lighted, flexible scope is inserted into the anus and then passed into the colon to examine the area. Polyps are sometimes found when a colonoscopy is done as part of routine cancer screening tests. °How is this treated? °Treatment for this condition involves removing any polyps that are found. Most polyps can be removed during a colonoscopy. Those polyps will then be tested for cancer. Additional treatment may be needed depending on the results of testing. °Follow these instructions at home: °Lifestyle °· Maintain a healthy weight, or lose weight if recommended by your health care provider. °· Exercise every day or as told by your health care provider. °· Do not use any products that contain nicotine or tobacco, such as cigarettes and e-cigarettes. If you need help quitting, ask your health care provider. °· If you drink alcohol, limit how much you have: °? 0-1 drink a day for women. °? 0-2 drinks a day for men. °· Be aware of how much alcohol is in your drink. In the [place] one drink equals one 12 oz bottle of beer (355 mL), one 5 oz glass of wine (148 mL), or one 1½ oz shot of hard liquor (44 mL). °Eating and drinking ° °· Eat foods that are high in fiber, such as fruits, vegetables, and whole grains. °· Eat foods that are high in calcium and vitamin D, such as milk, cheese, yogurt, eggs, liver, fish, and broccoli. °· Limit foods that are high in fat, such as fried foods and desserts. °· Limit the amount of red meat and  processed meat you eat, such as hot dogs, sausage, bacon, and lunch meats. °General instructions °· Keep all follow-up visits as told by your health care provider. This is important. °? This includes having regularly scheduled colonoscopies. °? Talk to your health care provider about when you need a colonoscopy. °Contact a health care provider if: °· You have new or worsening bleeding during a bowel movement. °· You have new or increased blood in your stool. °· You have a change in bowel habits. °· You lose weight for no known reason. °Summary °· Polyps are tissue growths inside the body. Polyps can grow in many places, including the colon. °· Most colon polyps are noncancerous (benign), but some can become cancerous over time. °· This condition is diagnosed with a colonoscopy. °· Treatment for this condition involves removing any polyps that are found. Most polyps can be removed during a   colonoscopy. This information is not intended to replace advice given to you by your health care provider. Make sure you discuss any questions you have with your health care provider. Document Released: 06/11/2004 Document Revised: 12/31/2017 Document Reviewed: 12/31/2017 Elsevier Patient Education  2020 [employer].

## 2019-06-23 NOTE — Op Note (Signed)
[facility] Patient Name: Brendan Ramirez Procedure Date: 06/23/2019 8:25 AM MRN: [id number] Date of Birth: 06/14/1946 Attending MD: Hildred Laser , MD CSN: [id number] Age: 73 Admit Type: Outpatient Procedure:                Colonoscopy Indications:              High risk colon cancer surveillance: Personal                            history of colonic polyps Providers:                Hildred Laser, MD, Otis Peak B. Sharon Seller, RN, Aram Candela Referring MD:             Delphina Cahill, MD Medicines:                Meperidine 50 mg IV, Midazolam 7 mg IV Complications:            No immediate complications. Estimated Blood Loss:     Estimated blood loss was minimal. Procedure:                Pre-Anesthesia Assessment:                           - Prior to the procedure, a History and Physical                            was performed, and patient medications and                            allergies were reviewed. The patient's tolerance of                            previous anesthesia was also reviewed. The risks                            and benefits of the procedure and the sedation                            options and risks were discussed with the patient.                            All questions were answered, and informed consent                            was obtained. Prior Anticoagulants: The patient has                            taken no previous anticoagulant or antiplatelet                            agents except for NSAID medication. ASA Grade  Assessment: II - A patient with mild systemic                            disease. After reviewing the risks and benefits,                            the patient was deemed in satisfactory condition to                            undergo the procedure.                           After obtaining informed consent, the colonoscope                            was passed under direct vision.  Throughout the                            procedure, the patient's blood pressure, pulse, and                            oxygen saturations were monitored continuously. The                            [id number]-[id number] scope was introduced through                            the anus and advanced to the the cecum, identified                            by appendiceal orifice and ileocecal valve. The                            colonoscopy was performed without difficulty. The                            patient tolerated the procedure well. The quality                            of the bowel preparation was adequate. The                            ileocecal valve, appendiceal orifice, and rectum                            were photographed. Scope In: 8:55:56 AM Scope Out: 9:17:46 AM Scope Withdrawal Time: 0 hours 13 minutes 47 seconds  Total Procedure Duration: 0 hours 21 minutes 50 seconds  Findings:      The perianal and digital rectal examinations were normal.      A diminutive polyp was found in the hepatic flexure. The polyp was       sessile. Biopsies were taken with a cold forceps for histology. The       pathology specimen was placed into Bottle Number 1.  Two polyps were found in the distal sigmoid colon and transverse colon.       The polyps were small in size. These polyps were removed with a cold       snare. Resection and retrieval were complete. The pathology specimen was       placed into Bottle Number 1.      External hemorrhoids were found during retroflexion. The hemorrhoids       were small. Impression:               - One diminutive polyp at the hepatic flexure.                            Biopsied.                           - Two small polyps in the distal sigmoid colon and                            in the transverse colon, removed with a cold snare.                            Resected and retrieved.                           - External hemorrhoids. Moderate  Sedation:      Moderate (conscious) sedation was administered by the endoscopy nurse       and supervised by the endoscopist. The following parameters were       monitored: oxygen saturation, heart rate, blood pressure, CO2       capnography and response to care. Total physician intraservice time was       28 minutes. Recommendation:           - Patient has a contact number available for                            emergencies. The signs and symptoms of potential                            delayed complications were discussed with the                            patient. Return to normal activities tomorrow.                            Written discharge instructions were provided to the                            patient.                           - Resume previous diet today.                           - Continue present medications.                           -  No aspirin, ibuprofen, naproxen, or other                            non-steroidal anti-inflammatory drugs for 1 day.                           - Await pathology results.                           - Repeat colonoscopy is recommended. The                            colonoscopy date will be determined after pathology                            results from today's exam become available for                            review. Procedure Code(s):        --- Professional ---                           [phone], Colonoscopy, flexible; with removal of                            tumor(s), polyp(s), or other lesion(s) by snare                            technique                           45380, 59, Colonoscopy, flexible; with biopsy,                            single or multiple                           99153, Moderate sedation; each additional 15                            minutes intraservice time                           G0500, Moderate sedation services provided by the                            same physician or other qualified health care                             professional performing a gastrointestinal                            endoscopic service that sedation supports,                            requiring the presence of an independent trained  observer to assist in the monitoring of the                            patient's level of consciousness and physiological                            status; initial 15 minutes of intra-service time;                            patient age 34 years or older (additional time may                            be reported with [phone], as appropriate) Diagnosis Code(s):        --- Professional ---                           K63.5, Polyp of colon                           Z86.010, Personal history of colonic polyps                           K64.4, Residual hemorrhoidal skin tags CPT copyright 2019 [employer]. All rights reserved. The codes documented in this report are preliminary and upon coder review may  be revised to meet current compliance requirements. Hildred Laser, MD Hildred Laser, MD 06/23/2019 9:29:33 AM This report has been signed electronically. Number of Addenda: 0

## 2019-06-24 LAB — SURGICAL PATHOLOGY

## 2019-06-28 ENCOUNTER — Encounter (HOSPITAL_COMMUNITY): Payer: Self-pay | Admitting: Internal Medicine

## 2019-11-08 ENCOUNTER — Ambulatory Visit: Payer: 59 | Attending: Internal Medicine

## 2019-11-10 ENCOUNTER — Ambulatory Visit: Payer: 59

## 2021-03-19 ENCOUNTER — Other Ambulatory Visit (HOSPITAL_COMMUNITY): Payer: Self-pay | Admitting: Radiology

## 2021-03-19 DIAGNOSIS — R0609 Other forms of dyspnea: Secondary | ICD-10-CM

## 2021-04-03 ENCOUNTER — Other Ambulatory Visit (HOSPITAL_COMMUNITY)
Admission: RE | Admit: 2021-04-03 | Discharge: 2021-04-03 | Disposition: A | Discharge status: Home / Self Care | Payer: 59 | Source: Ambulatory Visit | Attending: Internal Medicine | Admitting: Internal Medicine

## 2021-04-03 ENCOUNTER — Other Ambulatory Visit: Payer: Self-pay

## 2021-04-03 DIAGNOSIS — Z01812 Encounter for preprocedural laboratory examination: Secondary | ICD-10-CM | POA: Insufficient documentation

## 2021-04-03 DIAGNOSIS — Z20822 Contact with and (suspected) exposure to covid-19: Secondary | ICD-10-CM | POA: Diagnosis not present

## 2021-04-03 LAB — SARS CORONAVIRUS 2 (TAT 6-24 HRS): SARS Coronavirus 2: NEGATIVE

## 2021-04-05 ENCOUNTER — Ambulatory Visit (HOSPITAL_COMMUNITY)
Admission: RE | Admit: 2021-04-05 | Discharge: 2021-04-05 | Disposition: A | Discharge status: Home / Self Care | Payer: 59 | Source: Ambulatory Visit | Attending: Internal Medicine | Admitting: Internal Medicine

## 2021-04-05 ENCOUNTER — Other Ambulatory Visit: Payer: Self-pay

## 2021-04-05 DIAGNOSIS — R0609 Other forms of dyspnea: Secondary | ICD-10-CM | POA: Diagnosis not present

## 2021-04-05 LAB — PULMONARY FUNCTION TEST
DL/VA % pred: 101 %
DL/VA: 4.05 ml/min/mmHg/L
DLCO unc % pred: 82 %
DLCO unc: 20.76 ml/min/mmHg
FEF 25-75 Post: 3.57 L/sec
FEF 25-75 Pre: 3.12 L/sec
FEF2575-%Change-Post: 14 %
FEF2575-%Pred-Post: 157 %
FEF2575-%Pred-Pre: 137 %
FEV1-%Change-Post: 3 %
FEV1-%Pred-Post: 88 %
FEV1-%Pred-Pre: 85 %
FEV1-Post: 2.75 L
FEV1-Pre: 2.65 L
FEV1FVC-%Change-Post: 6 %
FEV1FVC-%Pred-Pre: 115 %
FEV6-%Change-Post: -1 %
FEV6-%Pred-Post: 77 %
FEV6-%Pred-Pre: 78 %
FEV6-Post: 3.1 L
FEV6-Pre: 3.14 L
FEV6FVC-%Pred-Post: 106 %
FEV6FVC-%Pred-Pre: 106 %
FVC-%Change-Post: -2 %
FVC-%Pred-Post: 72 %
FVC-%Pred-Pre: 74 %
FVC-Post: 3.1 L
FVC-Pre: 3.16 L
Post FEV1/FVC ratio: 89 %
Post FEV6/FVC ratio: 100 %
Pre FEV1/FVC ratio: 84 %
Pre FEV6/FVC Ratio: 100 %
RV % pred: 81 %
RV: 2.06 L
TLC % pred: 76 %
TLC: 5.41 L

## 2021-04-05 MED ORDER — ALBUTEROL SULFATE (2.5 MG/3ML) 0.083% IN NEBU
2.5000 mg | INHALATION_SOLUTION | Freq: Once | RESPIRATORY_TRACT | Status: AC
  Administered 2021-04-05: 2.5 mg via RESPIRATORY_TRACT

## 2021-11-19 ENCOUNTER — Other Ambulatory Visit: Payer: Self-pay | Admitting: Family Medicine

## 2021-11-19 ENCOUNTER — Other Ambulatory Visit (HOSPITAL_COMMUNITY): Payer: Self-pay | Admitting: Family Medicine

## 2021-11-19 ENCOUNTER — Ambulatory Visit (HOSPITAL_COMMUNITY)
Admission: RE | Admit: 2021-11-19 | Discharge: 2021-11-19 | Disposition: A | Discharge status: Home / Self Care | Payer: 59 | Source: Ambulatory Visit | Attending: Family Medicine | Admitting: Family Medicine

## 2021-11-19 ENCOUNTER — Other Ambulatory Visit: Payer: Self-pay

## 2021-11-19 DIAGNOSIS — M7989 Other specified soft tissue disorders: Secondary | ICD-10-CM | POA: Diagnosis not present

## 2021-11-20 ENCOUNTER — Other Ambulatory Visit (HOSPITAL_COMMUNITY): Payer: Self-pay | Admitting: Family Medicine

## 2021-11-20 DIAGNOSIS — R609 Edema, unspecified: Secondary | ICD-10-CM

## 2021-11-25 ENCOUNTER — Other Ambulatory Visit (HOSPITAL_COMMUNITY): Payer: Self-pay | Admitting: Family Medicine

## 2021-11-25 ENCOUNTER — Ambulatory Visit (HOSPITAL_COMMUNITY)
Admission: RE | Admit: 2021-11-25 | Discharge: 2021-11-25 | Disposition: A | Discharge status: Home / Self Care | Payer: 59 | Source: Ambulatory Visit | Attending: Family Medicine | Admitting: Family Medicine

## 2021-11-25 ENCOUNTER — Other Ambulatory Visit: Payer: Self-pay

## 2021-11-25 DIAGNOSIS — R0609 Other forms of dyspnea: Secondary | ICD-10-CM

## 2021-11-27 ENCOUNTER — Ambulatory Visit (INDEPENDENT_AMBULATORY_CARE_PROVIDER_SITE_OTHER): Payer: 59 | Admitting: Cardiology

## 2021-11-27 ENCOUNTER — Encounter: Payer: Self-pay | Admitting: Cardiology

## 2021-11-27 ENCOUNTER — Encounter: Payer: Self-pay | Admitting: *Deleted

## 2021-11-27 VITALS — BP 142/60 | HR 65 | Ht 71.0 in | Wt 317.6 lb

## 2021-11-27 DIAGNOSIS — R001 Bradycardia, unspecified: Secondary | ICD-10-CM | POA: Diagnosis not present

## 2021-11-27 DIAGNOSIS — R600 Localized edema: Secondary | ICD-10-CM | POA: Diagnosis not present

## 2021-11-27 DIAGNOSIS — R0602 Shortness of breath: Secondary | ICD-10-CM

## 2021-11-27 DIAGNOSIS — I10 Essential (primary) hypertension: Secondary | ICD-10-CM

## 2021-11-27 NOTE — Progress Notes (Signed)
? ? ?Cardiology Office Note ? ?Date: 11/27/2021  ? ?ID: Brendan Ramirez, DOB 1946-05-14, MRN [id number] ? ?PCP:  Celene Squibb, MD  ?Cardiologist:  Rozann Lesches, MD ?Electrophysiologist:  None  ? ?Chief Complaint  ?Patient presents with  ? Leg Swelling  ? ? ?History of Present Illness: ?Brendan Ramirez is a 76 y.o. male referred for cardiology consultation by Dr. Nevada Crane for evaluation of leg swelling, left worse than right.  He underwent lower extremity venous Dopplers which were negative for DVT and was started on Lasix with potassium supplement just recently.  As it turns out he has a significant sulfa allergy and feels very poorly when he takes Lasix, has not stayed on it.  Spontaneously his leg swelling has been improving, not back to baseline.  In retrospect, he reports gradual weight gain over the last 1 to 2 years, approximately 30 pounds.  Reports NYHA class II-III dyspnea.  No exertional chest pain.  Has a rare sense of brief palpitations but no syncope. ? ?I personally reviewed his ECG today which shows sinus bradycardia with low voltage. ? ?He was seen in our clinic back in 2019 for evaluation of longstanding bradycardia, not clearly symptomatic.  He underwent a GXT which showed adequate heart rate response to exercise, no obvious ischemic changes. ? ?Recently had chest x-ray on February 27 which showed mild hyperinflation with upper normal heart shadow, no infiltrates. His last echocardiogram was in 2019 at which point LVEF was 55 to 60% with moderate LVH, aortic valve sclerosis without stenosis and mild aortic regurgitation.  He has already been scheduled for a follow-up echocardiogram. ? ?Past Medical History:  ?Diagnosis Date  ? COPD (chronic obstructive pulmonary disease) ([place])   ? Essential hypertension   ? Mixed hyperlipidemia   ? OSA (obstructive sleep apnea)   ? Prediabetes   ? ? ?Past Surgical History:  ?Procedure Laterality Date  ? COLONOSCOPY N/A 06/23/2019  ? Procedure: COLONOSCOPY;  Surgeon:  Rogene Houston, MD;  Location: [facility];  Service: Endoscopy;  Laterality: N/A;  930  ? Left total arthroplasty  2002  ? POLYPECTOMY  06/23/2019  ? Procedure: POLYPECTOMY;  Surgeon: Rogene Houston, MD;  Location: [facility];  Service: Endoscopy;;  colon  ? ? ?Current Outpatient Medications  ?Medication Sig Dispense Refill  ? albuterol (VENTOLIN HFA) 108 (90 Base) MCG/ACT inhaler Inhale 1-2 puffs into the lungs as needed for wheezing or shortness of breath.    ? atorvastatin (LIPITOR) 10 MG tablet Take 10 mg by mouth daily.    ? Coenzyme Q10 (CO Q-10) 200 MG CAPS Take 200 mg by mouth daily.    ? fluticasone (FLONASE) 50 MCG/ACT nasal spray Place 1 spray into both nostrils daily.    ? gabapentin (NEURONTIN) 300 MG capsule Take 300 mg by mouth 2 (two) times daily.    ? levocetirizine (XYZAL) 5 MG tablet Take 5 mg by mouth daily.    ? Multiple Vitamin (MULTIVITAMIN) tablet Take 1 tablet by mouth daily.    ? naproxen sodium (ALEVE) 220 MG tablet Take 220 mg by mouth daily as needed (pain).     ? olmesartan (BENICAR) 40 MG tablet Take 40 mg by mouth daily.    ? Omega-3 Fatty Acids (FISH OIL) 1200 MG CAPS Take 1,200 mg by mouth 2 (two) times daily.    ? omeprazole (PRILOSEC) 20 MG capsule Take 20 mg by mouth daily.    ? tadalafil (CIALIS) 10 MG tablet Take 10  mg by mouth daily as needed for erectile dysfunction.    ? ?No current facility-administered medications for this visit.  ? ?Allergies:  Sulfa antibiotics  ? ?Social History: The patient  reports that he has quit smoking. His smoking use included cigarettes. He started smoking about 37 years ago. He has never used smokeless tobacco. He reports current alcohol use. He reports that he does not use drugs.  ? ?Family History: The patient's family history includes Colon cancer in an other family member.  ? ?ROS: No orthopnea or PND. ? ?Physical Exam: ?VS:  BP (!) 142/60   Pulse 65   Ht 5\' 11"  (1.803 m)   Wt (!) 317 lb 9.6 oz (144.1 kg)   SpO2 93%   BMI  44.30 kg/m? , BMI Body mass index is 44.3 kg/m?. ? ?Wt Readings from Last 3 Encounters:  ?11/27/21 (!) 317 lb 9.6 oz (144.1 kg)  ?06/23/19 290 lb (131.5 kg)  ?10/15/17 289 lb (131.1 kg)  ?  ?General: Patient appears comfortable at rest. ?HEENT: Conjunctiva and lids normal, wearing a mask. ?Neck: Supple, increased girth, difficult to assess JVP. ?Lungs: Clear to auscultation, nonlabored breathing at rest. ?Cardiac: Regular rate and rhythm, no S3, 9-6/2 systolic murmur, no pericardial rub. ?Abdomen: Obese, nontender, bowel sounds present.Marland Kitchen ?Extremities: 2+ bilateral lower leg edema, more prominent on the left.Marland Kitchen ?Skin: Warm and dry. ?Musculoskeletal: No kyphosis. ?Neuropsychiatric: Alert and oriented x3, affect grossly appropriate. ? ?ECG:  An ECG dated 10/15/2017 was personally reviewed today and demonstrated:  Sinus bradycardia with lead artifact. ? ?Recent Labwork: ? ?Recent lab work not available for review. ? ?Other Studies Reviewed Today: ? ?GXT 10/23/2017: ?Blood pressure demonstrated a normal response to exercise. ?There was no ST segment deviation noted during stress. ?Negative exercise stress test for ischemia. Duke treadmill score of 5 supports low risk for major cardiac events ?Appropriate heart rate response to exercise, reached 91% of THR with peak HR 134. ? ?Echocardiogram 10/23/2017: ?- Left ventricle: The cavity size was normal. Wall thickness was  ?  increased in a pattern of moderate LVH. Systolic function was  ?  normal. The estimated ejection fraction was in the range of 55%  ?  to 60%. Wall motion was normal; there were no regional wall  ?  motion abnormalities. The study is not technically sufficient to  ?  allow evaluation of LV diastolic function.  ?- Aortic valve: Moderately to severely calcified annulus.  ?  Trileaflet; mildly thickened leaflets. There was mild  ?  regurgitation. Valve area (VTI): 2.53 cm^2. Valve area (Vmax):  ?  2.53 cm^2. Valve area (Vmean): 2.37 cm^2.  ?- Mitral valve:  Mildly to moderately calcified annulus.  ?- Technically adequate study.  ? ?Lower extremity venous Dopplers 11/19/2021: ?IMPRESSION: ?No evidence of DVT in the left lower extremity. Limited study due to ?patient body habitus and edema. ? ?Chest x-ray 11/25/2021: ?FINDINGS: ?Heart is upper normal in size. Normal mediastinal contours. Normal ?pulmonary vasculature. Mild pulmonary hyperinflation. No confluent ?airspace disease, pleural effusion, or pneumothorax. No acute ?osseous abnormalities are seen. ?  ?IMPRESSION: ?Mild hyperinflation which may be related to bronchitis, asthma, or ?smoking history. ? ?Assessment and Plan: ? ?1.  Bilateral leg edema, left worse than right.  Recent lower extremity venous Dopplers were negative for DVT on the left.  He has had gradual, spontaneous improvement in swelling, could not take Lasix due to sulfa allergy with drug interaction.  Proceed with echocardiogram as ordered.  We may need to look into  a different diuretic regimen going forward that he can tolerate in the setting of sulfa allergy.  Main concern would be exclusion of cardiomyopathy.  LVEF as of 2019 was normal at 55 to 60%.  Request lab work from PCP. ? ?2.  Essential hypertension, blood pressure is mildly elevated today.  He is on Benicar at baseline. ? ?3.  Chronic sinus bradycardia, asymptomatic. ? ?4.  Mixed hyperlipidemia on Lipitor. ? ?Medication Adjustments/Labs and Tests Ordered: ?Current medicines are reviewed at length with the patient today.  Concerns regarding medicines are outlined above.  ? ?Tests Ordered: ?Orders Placed This Encounter  ?Procedures  ? EKG 12-Lead  ? ? ?Medication Changes: ?No orders of the defined types were placed in this encounter. ? ? ?Disposition:  Follow up  test results. ? ?Signed, ?Satira Sark, MD, [facility] ?11/27/2021 1:52 PM    ?[facility] ?[place], [place],  [address] ?Phone: [phone]; Fax: [phone]  ?

## 2021-11-27 NOTE — Patient Instructions (Signed)
Medication Instructions:  ?Your physician recommends that you continue on your current medications as directed. Please refer to the Current Medication list given to you today. ? ?Labwork: ?none ? ?Testing/Procedures: ?none ? ?Follow-Up: ?Your physician recommends that you schedule a follow-up appointment in: pending ? ?Any Other Special Instructions Will Be Listed Below (If Applicable). ? ?If you need a refill on your cardiac medications before your next appointment, please call your pharmacy. ?

## 2021-12-13 ENCOUNTER — Ambulatory Visit (HOSPITAL_COMMUNITY)
Admission: RE | Admit: 2021-12-13 | Discharge: 2021-12-13 | Disposition: A | Discharge status: Home / Self Care | Payer: 59 | Source: Ambulatory Visit | Attending: Family Medicine | Admitting: Family Medicine

## 2021-12-13 DIAGNOSIS — R609 Edema, unspecified: Secondary | ICD-10-CM | POA: Insufficient documentation

## 2021-12-13 DIAGNOSIS — R600 Localized edema: Secondary | ICD-10-CM

## 2021-12-13 LAB — ECHOCARDIOGRAM COMPLETE
AR max vel: 1.65 cm2
AV Area VTI: 1.72 cm2
AV Area mean vel: 1.84 cm2
AV Mean grad: 10.5 mmHg
AV Peak grad: 24.1 mmHg
Ao pk vel: 2.46 m/s
Area-P 1/2: 1.97 cm2
S' Lateral: 3.3 cm

## 2021-12-13 MED ORDER — PERFLUTREN LIPID MICROSPHERE
1.0000 mL | INTRAVENOUS | Status: AC | PRN
  Administered 2021-12-13: 6 mL via INTRAVENOUS
  Filled 2021-12-13: qty 10

## 2021-12-13 NOTE — Progress Notes (Signed)
*  PRELIMINARY RESULTS* ?Echocardiogram ?2D Echocardiogram has been performed. ? ?Brendan Ramirez ?12/13/2021, 2:21 PM ?

## 2021-12-16 ENCOUNTER — Telehealth: Payer: Self-pay | Admitting: *Deleted

## 2021-12-16 DIAGNOSIS — R600 Localized edema: Secondary | ICD-10-CM

## 2021-12-16 NOTE — Telephone Encounter (Signed)
-----   Message from Satira Sark, MD sent at 12/16/2021  8:10 AM EDT ----- ?Regarding: RE: remind Brendan Ramirez to look at echo ?I took a look at the echocardiogram which shows vigorous LVEF at 65 to 23%, mild diastolic dysfunction and increased left atrial pressure, also mild aortic stenosis.  He probably should have a prescription for an as needed diuretic. With his sulfa allergy would please check with Pharm.D. regarding best choice of a reasonably potent diuretic that we could use that would not interact with his sulfa allergy.  Otherwise office follow-up in 6 months.  Just let me know about the diuretic. ?----- Message ----- ?From: Merlene Laughter, RN ?Sent: 12/16/2021   7:26 AM EDT ?To: Satira Sark, MD ?Subject: FW: remind McDowell to look at echo           ? ? ?----- Message ----- ?From: Merlene Laughter, RN ?Sent: 12/13/2021  12:00 AM EDT ?To: Merlene Laughter, RN ?Subject: remind Brendan Ramirez to look at echo               ? ? ? ? ? ?

## 2021-12-17 NOTE — Telephone Encounter (Signed)
Per PharmD, Brendan Ramirez-Recommend Amiloride '5mg'$  daily with close eye on BMP in 1-2 weeks. Can increase to '10mg'$  daily, or alternatively, Triamterene '100mg'$  once or twice daily after meals.  Recheck BMP in 1-2 weeks.   ? ?Per McDowell-McDowell, Brendan Gell, MD  Merlene Laughter, RN ?Let's go with triamterene 100 mg once or twice daily for edema PRN.  ?

## 2021-12-18 ENCOUNTER — Other Ambulatory Visit: Payer: Self-pay | Admitting: *Deleted

## 2021-12-18 DIAGNOSIS — R600 Localized edema: Secondary | ICD-10-CM

## 2021-12-18 MED ORDER — TRIAMTERENE 100 MG PO CAPS: 100.0000 mg | ORAL_CAPSULE | Freq: Two times a day (BID) | ORAL | 1 refills | Status: DC | PRN

## 2021-12-18 NOTE — Telephone Encounter (Signed)
Patient informed and verbalized understanding of plan. ?Will have lab work done at PCP [employer] faxed. ?

## 2021-12-18 NOTE — Telephone Encounter (Signed)
Patient informed and says PCP started him on hctz 25 mg daily and he hasn't noticed any weight loss since being on it. Advised that message would be sent to provider to confirm switch from hctz to triamterene. Verbalized understanding.   ?

## 2021-12-19 ENCOUNTER — Telehealth: Payer: Self-pay | Admitting: Cardiology

## 2021-12-19 NOTE — Telephone Encounter (Signed)
Echo result faxed to Dr. Juel Burrow office ?

## 2021-12-19 NOTE — Telephone Encounter (Signed)
Per Dr. Domenic Polite, I took a look at the echocardiogram which shows vigorous LVEF at 65 to 90%, mild diastolic dysfunction and increased left atrial pressure, also mild aortic stenosis.  He probably should have a prescription for an as needed diuretic. With his sulfa allergy would please check with Pharm.D. regarding best choice of a reasonably potent diuretic that we could use that would not interact with his sulfa allergy.  Otherwise office follow-up in 6 months ?

## 2021-12-19 NOTE — Telephone Encounter (Signed)
Ebony from Dr. Juel Burrow office sent Echo results to Dr. Domenic Polite via fax today. The PCP office would like DR. McDowell to interpret them and then call Dr. Nevada Crane to discuss the results.  ?Please call [phone] ? ?

## 2022-01-08 ENCOUNTER — Encounter: Payer: Self-pay | Admitting: *Deleted

## 2022-01-11 ENCOUNTER — Other Ambulatory Visit: Payer: Self-pay | Admitting: Cardiology

## 2022-01-20 ENCOUNTER — Encounter (HOSPITAL_BASED_OUTPATIENT_CLINIC_OR_DEPARTMENT_OTHER): Payer: Self-pay

## 2022-02-09 ENCOUNTER — Other Ambulatory Visit: Payer: Self-pay | Admitting: Cardiology

## 2022-02-10 ENCOUNTER — Encounter: Payer: Self-pay | Admitting: *Deleted

## 2022-02-10 ENCOUNTER — Other Ambulatory Visit: Payer: Self-pay | Admitting: Cardiology

## 2022-05-13 NOTE — Progress Notes (Signed)
Cardiology Office Note    Date:  05/20/2022   ID:  Brendan Ramirez, Brendan Ramirez [birth year], MRN [id number]   PCP:  Celene Squibb, MD   [place]  Cardiologist:  Rozann Lesches, MD   Advanced Practice Provider:  No care team member to display Electrophysiologist:  None   [id number]}   Chief Complaint  Patient presents with   Follow-up    History of Present Illness:  Brendan Ramirez is a 76 y.o. male with history of HTN, chronic bradycardia, HLD,   Patient saw Dr. Domenic Polite 11/2021 for LE edema, couldn't take lasix due to sulfa allergy with drug interaction. Echo normal LVEF 65-70% G1DD mod LVH, mild AS. Prn diuretic recommended.   Patient comes in for f/u. He's taking ethacrynic acid daily-broke out in rash from triamterene. Has chronic DOE unchanged. No regular exercise. No chest pain, dizziness. Wheezing and trouble sleeping because of it. Does use CPAP managed by Dr. Nevada Crane. Goes to bed at 6 am and sleeps until 2 pm. Spends time reading, TV, computer.     Past Medical History:  Diagnosis Date   COPD (chronic obstructive pulmonary disease) ([facility])    Essential hypertension    Mixed hyperlipidemia    OSA (obstructive sleep apnea)    Prediabetes     Past Surgical History:  Procedure Laterality Date   COLONOSCOPY N/A 06/23/2019   Procedure: COLONOSCOPY;  Surgeon: Rogene Houston, MD;  [facility]: [facility];  Service: Endoscopy;  Laterality: N/A;  930   Left total arthroplasty  2002   POLYPECTOMY  06/23/2019   Procedure: POLYPECTOMY;  Surgeon: Rogene Houston, MD;  Location: [facility];  Service: Endoscopy;;  colon    Current Medications: Current Meds  Medication Sig   albuterol (VENTOLIN HFA) 108 (90 Base) MCG/ACT inhaler Inhale 1-2 puffs into the lungs as needed for wheezing or shortness of breath.   atorvastatin (LIPITOR) 10 MG tablet Take 10 mg by mouth daily.   Coenzyme Q10 (CO Q-10) 200 MG CAPS Take 200 mg by mouth daily.   ethacrynic acid  (EDECRIN) 25 MG tablet Take 50 mg by mouth daily.   fluticasone (FLONASE) 50 MCG/ACT nasal spray Place 1 spray into both nostrils daily.   gabapentin (NEURONTIN) 300 MG capsule Take 300 mg by mouth 2 (two) times daily.   levocetirizine (XYZAL) 5 MG tablet Take 5 mg by mouth daily.   Multiple Vitamin (MULTIVITAMIN) tablet Take 1 tablet by mouth daily.   olmesartan (BENICAR) 40 MG tablet Take 40 mg by mouth daily.   Omega-3 Fatty Acids (FISH OIL) 1200 MG CAPS Take 1,200 mg by mouth 2 (two) times daily.   omeprazole (PRILOSEC) 20 MG capsule Take 20 mg by mouth daily.   tadalafil (CIALIS) 10 MG tablet Take 10 mg by mouth daily as needed for erectile dysfunction.     Allergies:   Sulfa antibiotics   Social History   Socioeconomic History   Marital status: Widowed    Spouse name: Not on file   Number of children: Not on file   Years of education: Not on file   Highest education level: Not on file  Occupational History   Not on file  Tobacco Use   Smoking status: Former    Types: Cigarettes    Start date: 12/12/1983   Smokeless tobacco: Never  Vaping Use   Vaping Use: Never used  Substance and Sexual Activity   Alcohol use: Yes    Comment: Socially  Drug use: No   Sexual activity: Not on file  Other Topics Concern   Not on file  Social History Narrative   Not on file   Social Determinants of Health   Financial Resource Strain: Not on file  Food Insecurity: Not on file  Transportation Needs: Not on file  Physical Activity: Not on file  Stress: Not on file  Social Connections: Not on file     Family History:  The patient's  family history includes Colon cancer in an other family member.   ROS:   Please see the history of present illness.    ROS All other systems reviewed and are negative.   PHYSICAL EXAM:   VS:  BP (!) 142/58   Pulse 66   Ht '5\' 11"'$  (1.803 m)   Wt (!) 316 lb (143.3 kg)   SpO2 94%   BMI 44.07 kg/m   Physical Exam  GEN: Obese, in no acute  distress  Neck: no JVD, carotid bruits, or masses Cardiac:RRR; no murmurs, rubs, or gallops  Respiratory: inspiratory wheezing   GI: soft, nontender, nondistended, + BS Ext: without cyanosis, clubbing, or edema, Good distal pulses bilaterally Neuro:  Alert and Oriented x 3, Psych: euthymic mood, full affect  Wt Readings from Last 3 Encounters:  05/20/22 (!) 316 lb (143.3 kg)  11/27/21 (!) 317 lb 9.6 oz (144.1 kg)  06/23/19 290 lb (131.5 kg)      Studies/Labs Reviewed:   EKG:  EKG is not ordered today.   Recent Labs: No results found for requested labs within last 365 days.   Lipid Panel No results found for: "CHOL", "TRIG", "HDL", "CHOLHDL", "VLDL", "LDLCALC", "LDLDIRECT"  Additional studies/ records that were reviewed today include:  Echo 11/2021 IMPRESSIONS     1. Left ventricular ejection fraction, by estimation, is 65 to 70%. The  left ventricle has normal function. The left ventricle has no regional  wall motion abnormalities. There is moderate left ventricular hypertrophy.  Left ventricular diastolic  parameters are consistent with Grade I diastolic dysfunction (impaired  relaxation). Elevated left atrial pressure.   2. Right ventricular systolic function is normal. The right ventricular  size is normal. Tricuspid regurgitation signal is inadequate for assessing  PA pressure.   3. The mitral valve was not well visualized. Trivial mitral valve  regurgitation. No evidence of mitral stenosis.   4. The aortic valve has an indeterminant number of cusps. There is  moderate calcification of the aortic valve. There is moderate thickening  of the aortic valve. Aortic valve regurgitation is not visualized. Mild  aortic valve stenosis.   5. Aortic dilatation noted. There is mild dilatation of the ascending  aorta, measuring 36 mm.   FINDINGS   GXT 10/23/2017: Blood pressure demonstrated a normal response to exercise. There was no ST segment deviation noted during  stress. Negative exercise stress test for ischemia. Duke treadmill score of 5 supports low risk for major cardiac events Appropriate heart rate response to exercise, reached 91% of THR with peak HR 134.   Echocardiogram 10/23/2017: - Left ventricle: The cavity size was normal. Wall thickness was    increased in a pattern of moderate LVH. Systolic function was    normal. The estimated ejection fraction was in the range of 55%    to 60%. Wall motion was normal; there were no regional wall    motion abnormalities. The study is not technically sufficient to    allow evaluation of LV diastolic function.  - Aortic  valve: Moderately to severely calcified annulus.    Trileaflet; mildly thickened leaflets. There was mild    regurgitation. Valve area (VTI): 2.53 cm^2. Valve area (Vmax):    2.53 cm^2. Valve area (Vmean): 2.37 cm^2.  - Mitral valve: Mildly to moderately calcified annulus.  - Technically adequate study.    Lower extremity venous Dopplers 11/19/2021: IMPRESSION: No evidence of DVT in the left lower extremity. Limited study due to patient body habitus and edema.   Chest x-ray 11/25/2021: FINDINGS: Heart is upper normal in size. Normal mediastinal contours. Normal pulmonary vasculature. Mild pulmonary hyperinflation. No confluent airspace disease, pleural effusion, or pneumothorax. No acute osseous abnormalities are seen.   IMPRESSION: Mild hyperinflation which may be related to bronchitis, asthma, or smoking history.   Risk Assessment/Calculations:         ASSESSMENT:    1. Bilateral leg edema   2. Essential hypertension   3. Sinus bradycardia   4. Hyperlipidemia, unspecified hyperlipidemia type   5. Morbid obesity ([place])   6. OSA on CPAP      PLAN:  In order of problems listed above:  LE edema, can't take lasix due to sulfa allergy, didn't tolerate triamterene and now on ethacrynic acid 25 mg 2 daily. Labs stable in June on [facility]  HTN BP stable 2 gm sodium  diet  Chronic sinus bradycardia-asymptomatic  HLD. LDL 60 03/03/22  Obesity-has gained more weight-exercise and weight loss recommended  OSA on CPAP  Shared Decision Making/Informed Consent        Medication Adjustments/Labs and Tests Ordered: Current medicines are reviewed at length with the patient today.  Concerns regarding medicines are outlined above.  Medication changes, Labs and Tests ordered today are listed in the Patient Instructions below. Patient Instructions  Medication Instructions:  Your physician recommends that you continue on your current medications as directed. Please refer to the Current Medication list given to you today.   Labwork: None  Testing/Procedures: None  Follow-Up: Follow up with Dr Domenic Polite in 6 months.    Any Other Special Instructions Will Be Listed Below (If Applicable).     If you need a refill on your cardiac medications before your next appointment, please call your pharmacy.  Two Gram Sodium Diet 2000 mg  What is Sodium? Sodium is a mineral found naturally in many foods. The most significant source of sodium in the diet is table salt, which is about 40% sodium.  Processed, convenience, and preserved foods also contain a large amount of sodium.  The body needs only 500 mg of sodium daily to function,  A normal diet provides more than enough sodium even if you do not use salt.  Why Limit Sodium? A build up of sodium in the body can cause thirst, increased blood pressure, shortness of breath, and water retention.  Decreasing sodium in the diet can reduce edema and risk of heart attack or stroke associated with high blood pressure.  Keep in mind that there are many other factors involved in these health problems.  Heredity, obesity, lack of exercise, cigarette smoking, stress and what you eat all play a role.  General Guidelines: Do not add salt at the table or in cooking.  One teaspoon of salt contains over 2 grams of sodium. Read food  labels Avoid processed and convenience foods Ask your dietitian before eating any foods not dicussed in the menu planning guidelines Consult your physician if you wish to use a salt substitute or a sodium containing medication such as antacids.  Limit milk and milk products to 16 oz (2 cups) per day.  Shopping Hints: READ LABELS!! "Dietetic" does not necessarily mean low sodium. Salt and other sodium ingredients are often added to foods during processing.    Menu Planning Guidelines Food Group Choose More Often Avoid  Beverages (see also the milk group All fruit juices, low-sodium, salt-free vegetables juices, low-sodium carbonated beverages Regular vegetable or tomato juices, commercially softened water used for drinking or cooking  Breads and Cereals Enriched white, wheat, rye and pumpernickel bread, hard rolls and dinner rolls; muffins, cornbread and waffles; most dry cereals, cooked cereal without added salt; unsalted crackers and breadsticks; low sodium or homemade bread crumbs Bread, rolls and crackers with salted tops; quick breads; instant hot cereals; pancakes; commercial bread stuffing; self-rising flower and biscuit mixes; regular bread crumbs or cracker crumbs  Desserts and Sweets Desserts and sweets mad with mild should be within allowance Instant pudding mixes and cake mixes  Fats Butter or margarine; vegetable oils; unsalted salad dressings, regular salad dressings limited to 1 Tbs; light, sour and heavy cream Regular salad dressings containing bacon fat, bacon bits, and salt pork; snack dips made with instant soup mixes or processed cheese; salted nuts  Fruits Most fresh, frozen and canned fruits Fruits processed with salt or sodium-containing ingredient (some dried fruits are processed with sodium sulfites        Vegetables Fresh, frozen vegetables and low- sodium canned vegetables Regular canned vegetables, sauerkraut, pickled vegetables, and others prepared in brine; frozen  vegetables in sauces; vegetables seasoned with ham, bacon or salt pork  Condiments, Sauces, Miscellaneous  Salt substitute with physician's approval; pepper, herbs, spices; vinegar, lemon or lime juice; hot pepper sauce; garlic powder, onion powder, low sodium soy sauce (1 Tbs.); low sodium condiments (ketchup, chili sauce, mustard) in limited amounts (1 tsp.) fresh ground horseradish; unsalted tortilla chips, pretzels, potato chips, popcorn, salsa (1/4 cup) Any seasoning made with salt including garlic salt, celery salt, onion salt, and seasoned salt; sea salt, rock salt, kosher salt; meat tenderizers; monosodium glutamate; mustard, regular soy sauce, barbecue, sauce, chili sauce, teriyaki sauce, steak sauce, Worcestershire sauce, and most flavored vinegars; canned gravy and mixes; regular condiments; salted snack foods, olives, picles, relish, horseradish sauce, catsup   Food preparation: Try these seasonings Meats:    Pork Sage, onion Serve with applesauce  Chicken Poultry seasoning, thyme, parsley Serve with cranberry sauce  Lamb Curry powder, rosemary, garlic, thyme Serve with mint sauce or jelly  Veal Marjoram, basil Serve with current jelly, cranberry sauce  Beef Pepper, bay leaf Serve with dry mustard, unsalted chive butter  Fish [place] leaf, dill Serve with unsalted lemon butter, unsalted parsley butter  Vegetables:    Asparagus Lemon juice   Broccoli Lemon juice   Carrots Mustard dressing parsley, mint, nutmeg, glazed with unsalted butter and sugar   Green beans Marjoram, lemon juice, nutmeg,dill seed   Tomatoes Basil, marjoram, onion   Spice /blend for [employer]" 4 tsp ground thyme 1 tsp ground sage 3 tsp ground rosemary 4 tsp ground marjoram   Test your knowledge A product that says "Salt Free" may still contain sodium. True or False Garlic Powder and Hot Pepper Sauce an be used as alternative seasonings.True or False Processed foods have more sodium than fresh foods.  True or  False Canned Vegetables have less sodium than froze True or False   WAYS TO DECREASE YOUR SODIUM INTAKE Avoid the use of added salt in cooking and at the table.  Table salt (and other prepared seasonings which contain salt) is probably one of the greatest sources of sodium in the diet.  Unsalted foods can gain flavor from the sweet, sour, and butter taste sensations of herbs and spices.  Instead of using salt for seasoning, try the following seasonings with the foods listed.  Remember: how you use them to enhance natural food flavors is limited only by your creativity... Allspice-Meat, fish, eggs, fruit, peas, red and yellow vegetables Almond Extract-Fruit baked goods Anise Seed-Sweet breads, fruit, carrots, beets, cottage cheese, cookies (tastes like licorice) Basil-Meat, fish, eggs, vegetables, rice, vegetables salads, soups, sauces Bay Leaf-Meat, fish, stews, poultry Burnet-Salad, vegetables (cucumber-like flavor) Caraway Seed-Bread, cookies, cottage cheese, meat, vegetables, cheese, rice Cardamon-Baked goods, fruit, soups Celery Powder or seed-Salads, salad dressings, sauces, meatloaf, soup, bread.Do not use  celery salt Chervil-Meats, salads, fish, eggs, vegetables, cottage cheese (parsley-like flavor) Chili Power-Meatloaf, chicken cheese, corn, eggplant, egg dishes Chives-Salads cottage cheese, egg dishes, soups, vegetables, sauces Cilantro-Salsa, casseroles Cinnamon-Baked goods, fruit, pork, lamb, chicken, carrots Cloves-Fruit, baked goods, fish, pot roast, green beans, beets, carrots Coriander-Pastry, cookies, meat, salads, cheese (lemon-orange flavor) Cumin-Meatloaf, fish,cheese, eggs, cabbage,fruit pie (caraway flavor) [employer], fruit, eggs, fish, poultry, cottage cheese, vegetables Dill Seed-Meat, cottage cheese, poultry, vegetables, fish, salads, bread Fennel Seed-Bread, cookies, apples, pork, eggs, fish, beets, cabbage, cheese, Licorice-like flavor Garlic-(buds or  powder) Salads, meat, poultry, fish, bread, butter, vegetables, potatoes.Do not  use garlic salt Ginger-Fruit, vegetables, baked goods, meat, fish, poultry Horseradish Root-Meet, vegetables, butter Lemon Juice or Extract-Vegetables, fruit, tea, baked goods, fish salads Mace-Baked goods fruit, vegetables, fish, poultry (taste like nutmeg) Maple Extract-Syrups Marjoram-Meat, chicken, fish, vegetables, breads, green salads (taste like Sage) Mint-Tea, lamb, sherbet, vegetables, desserts, carrots, cabbage Mustard, Dry or Seed-Cheese, eggs, meats, vegetables, poultry Nutmeg-Baked goods, fruit, chicken, eggs, vegetables, desserts Onion Powder-Meat, fish, poultry, vegetables, cheese, eggs, bread, rice salads (Do not use   Onion salt) Orange Extract-Desserts, baked goods Oregano-Pasta, eggs, cheese, onions, pork, lamb, fish, chicken, vegetables, green salads Paprika-Meat, fish, poultry, eggs, cheese, vegetables Parsley Flakes-Butter, vegetables, meat fish, poultry, eggs, bread, salads (certain forms may   Contain sodium Pepper-Meat fish, poultry, vegetables, eggs Peppermint Extract-Desserts, baked goods Poppy Seed-Eggs, bread, cheese, fruit dressings, baked goods, noodles, vegetables, cottage  [employer], poultry, meat, fish, cauliflower, turnips,eggs bread Saffron-Rice, bread, veal, chicken, fish, eggs Sage-Meat, fish, poultry, onions, eggplant, tomateos, pork, stews Savory-Eggs, salads, poultry, meat, rice, vegetables, soups, pork Tarragon-Meat, poultry, fish, eggs, butter, vegetables (licorice-like flavor)  Thyme-Meat, poultry, fish, eggs, vegetables, (clover-like flavor), sauces, soups Tumeric-Salads, butter, eggs, fish, rice, vegetables (saffron-like flavor) Vanilla Extract-Baked goods, candy Vinegar-Salads, vegetables, meat marinades Walnut Extract-baked goods, candy   2. Choose your Foods Wisely   The following is a list of foods to avoid  which are high in sodium:  Meats-Avoid all smoked, canned, salt cured, dried and kosher meat and fish as well as Anchovies   [employer] meats:Bologna, Liverwurst, Pastrami Canned meat or fish  Marinated herring Caviar    Pepperoni Corned Beef   Pizza Dried chipped beef  Salami Frozen breaded fish or meat Salt pork Frankfurters or hot dogs  Sardines Gefilte fish   Sausage Ham (boiled ham, Proscuitto Smoked butt    spiced ham)   Spam      TV Dinners Vegetables Canned vegetables (Regular) Relish Canned mushrooms  Sauerkraut Olives    Tomato juice Pickles  Bakery and Dessert Products Canned puddings  Cream pies Cheesecake   Decorated cakes  Cookies  Beverages/Juices Tomato juice, regular  Gatorade   V-8 vegetable juice, regular  Breads and Cereals Biscuit mixes   Salted potato chips, corn chips, pretzels Bread stuffing mixes  Salted crackers and rolls Pancake and waffle mixes Self-rising flour  Seasonings Accent    Meat sauces Barbecue sauce  Meat tenderizer Catsup    Monosodium glutamate (MSG) Celery salt   Onion salt Chili sauce   Prepared mustard Garlic salt   Salt, seasoned salt, sea salt Gravy mixes   Soy sauce Horseradish   Steak sauce Ketchup   Tartar sauce Lite salt    Teriyaki sauce Marinade mixes   Worcestershire sauce  Others Baking powder   Cocoa and cocoa mixes Baking soda   Commercial casserole mixes Candy-caramels, chocolate  Dehydrated soups    Bars, fudge,nougats  Instant rice and pasta mixes Canned broth or soup  Maraschino cherries Cheese, aged and processed cheese and cheese spreads  Learning Assessment Quiz  Indicated T (for True) or F (for False) for each of the following statements:  _____ Fresh fruits and vegetables and unprocessed grains are generally low in sodium _____ Water may contain a considerable amount of sodium, depending on the source _____ You can always tell if a food is high in sodium by tasting  it _____ Certain laxatives my be high in sodium and should be avoided unless prescribed   by a physician or pharmacist _____ Salt substitutes may be used freely by anyone on a sodium restricted diet _____ Sodium is present in table salt, food additives and as a natural component of   most foods _____ Table salt is approximately 90% sodium _____ Limiting sodium intake may help prevent excess fluid accumulation in the body _____ On a sodium-restricted diet, seasonings such as bouillon soy sauce, and    cooking wine should be used in place of table salt _____ On an ingredient list, a product which lists monosodium glutamate as the first   ingredient is an appropriate food to include on a low sodium diet  Circle the best answer(s) to the following statements (Hint: there may be more than one correct answer)  11. On a low-sodium diet, some acceptable snack items are:    A. Olives  F. Bean dip   K. Grapefruit juice    B. Salted Pretzels G. Commercial Popcorn   L. Canned peaches    C. Carrot Sticks  H. Bouillon   M. Unsalted nuts   D. [place] fries  I. Peanut butter crackers N. Salami   E. Sweet pickles J. Tomato Juice   O. Pizza  12.  Seasonings that may be used freely on a reduced - sodium diet include   A. Lemon wedges F.Monosodium glutamate K. Celery seed    B.Soysauce   G. Pepper   L. Mustard powder   C. Sea salt  H. Cooking wine  M. Onion flakes   D. Vinegar  E. Prepared horseradish N. Salsa   E. Sage   J. Worcestershire sauce  O. [address]    Sumner Boast, PA-C  05/20/2022 2:32 PM    [facility] [place], [place], [place]  [address] Phone: [phone]; Fax: [phone]

## 2022-05-20 ENCOUNTER — Encounter: Payer: Self-pay | Admitting: Physician Assistant

## 2022-05-20 ENCOUNTER — Ambulatory Visit (INDEPENDENT_AMBULATORY_CARE_PROVIDER_SITE_OTHER): Payer: 59 | Admitting: Physician Assistant

## 2022-05-20 VITALS — BP 142/58 | HR 66 | Ht 71.0 in | Wt 316.0 lb

## 2022-05-20 DIAGNOSIS — I10 Essential (primary) hypertension: Secondary | ICD-10-CM

## 2022-05-20 DIAGNOSIS — G4733 Obstructive sleep apnea (adult) (pediatric): Secondary | ICD-10-CM

## 2022-05-20 DIAGNOSIS — Z9989 Dependence on other enabling machines and devices: Secondary | ICD-10-CM

## 2022-05-20 DIAGNOSIS — E785 Hyperlipidemia, unspecified: Secondary | ICD-10-CM

## 2022-05-20 DIAGNOSIS — R001 Bradycardia, unspecified: Secondary | ICD-10-CM

## 2022-05-20 DIAGNOSIS — R600 Localized edema: Secondary | ICD-10-CM

## 2022-05-20 NOTE — Patient Instructions (Addendum)
Medication Instructions:  Your physician recommends that you continue on your current medications as directed. Please refer to the Current Medication list given to you today.   Labwork: None  Testing/Procedures: None  Follow-Up: Follow up with Dr Domenic Polite in 6 months.    Any Other Special Instructions Will Be Listed Below (If Applicable).     If you need a refill on your cardiac medications before your next appointment, please call your pharmacy.  Two Gram Sodium Diet 2000 mg  What is Sodium? Sodium is a mineral found naturally in many foods. The most significant source of sodium in the diet is table salt, which is about 40% sodium.  Processed, convenience, and preserved foods also contain a large amount of sodium.  The body needs only 500 mg of sodium daily to function,  A normal diet provides more than enough sodium even if you do not use salt.  Why Limit Sodium? A build up of sodium in the body can cause thirst, increased blood pressure, shortness of breath, and water retention.  Decreasing sodium in the diet can reduce edema and risk of heart attack or stroke associated with high blood pressure.  Keep in mind that there are many other factors involved in these health problems.  Heredity, obesity, lack of exercise, cigarette smoking, stress and what you eat all play a role.  General Guidelines: Do not add salt at the table or in cooking.  One teaspoon of salt contains over 2 grams of sodium. Read food labels Avoid processed and convenience foods Ask your dietitian before eating any foods not dicussed in the menu planning guidelines Consult your physician if you wish to use a salt substitute or a sodium containing medication such as antacids.  Limit milk and milk products to 16 oz (2 cups) per day.  Shopping Hints: READ LABELS!! "Dietetic" does not necessarily mean low sodium. Salt and other sodium ingredients are often added to foods during processing.    Menu Planning  Guidelines Food Group Choose More Often Avoid  Beverages (see also the milk group All fruit juices, low-sodium, salt-free vegetables juices, low-sodium carbonated beverages Regular vegetable or tomato juices, commercially softened water used for drinking or cooking  Breads and Cereals Enriched white, wheat, rye and pumpernickel bread, hard rolls and dinner rolls; muffins, cornbread and waffles; most dry cereals, cooked cereal without added salt; unsalted crackers and breadsticks; low sodium or homemade bread crumbs Bread, rolls and crackers with salted tops; quick breads; instant hot cereals; pancakes; commercial bread stuffing; self-rising flower and biscuit mixes; regular bread crumbs or cracker crumbs  Desserts and Sweets Desserts and sweets mad with mild should be within allowance Instant pudding mixes and cake mixes  Fats Butter or margarine; vegetable oils; unsalted salad dressings, regular salad dressings limited to 1 Tbs; light, sour and heavy cream Regular salad dressings containing bacon fat, bacon bits, and salt pork; snack dips made with instant soup mixes or processed cheese; salted nuts  Fruits Most fresh, frozen and canned fruits Fruits processed with salt or sodium-containing ingredient (some dried fruits are processed with sodium sulfites        Vegetables Fresh, frozen vegetables and low- sodium canned vegetables Regular canned vegetables, sauerkraut, pickled vegetables, and others prepared in brine; frozen vegetables in sauces; vegetables seasoned with ham, bacon or salt pork  Condiments, Sauces, Miscellaneous  Salt substitute with physician's approval; pepper, herbs, spices; vinegar, lemon or lime juice; hot pepper sauce; garlic powder, onion powder, low sodium soy sauce (1 Tbs.);  low sodium condiments (ketchup, chili sauce, mustard) in limited amounts (1 tsp.) fresh ground horseradish; unsalted tortilla chips, pretzels, potato chips, popcorn, salsa (1/4 cup) Any seasoning made  with salt including garlic salt, celery salt, onion salt, and seasoned salt; sea salt, rock salt, kosher salt; meat tenderizers; monosodium glutamate; mustard, regular soy sauce, barbecue, sauce, chili sauce, teriyaki sauce, steak sauce, Worcestershire sauce, and most flavored vinegars; canned gravy and mixes; regular condiments; salted snack foods, olives, picles, relish, horseradish sauce, catsup   Food preparation: Try these seasonings Meats:    Pork Sage, onion Serve with applesauce  Chicken Poultry seasoning, thyme, parsley Serve with cranberry sauce  Lamb Curry powder, rosemary, garlic, thyme Serve with mint sauce or jelly  Veal Marjoram, basil Serve with current jelly, cranberry sauce  Beef Pepper, bay leaf Serve with dry mustard, unsalted chive butter  Fish [place] leaf, dill Serve with unsalted lemon butter, unsalted parsley butter  Vegetables:    Asparagus Lemon juice   Broccoli Lemon juice   Carrots Mustard dressing parsley, mint, nutmeg, glazed with unsalted butter and sugar   Green beans Marjoram, lemon juice, nutmeg,dill seed   Tomatoes Basil, marjoram, onion   Spice /blend for [employer]" 4 tsp ground thyme 1 tsp ground sage 3 tsp ground rosemary 4 tsp ground marjoram   Test your knowledge A product that says "Salt Free" may still contain sodium. True or False Garlic Powder and Hot Pepper Sauce an be used as alternative seasonings.True or False Processed foods have more sodium than fresh foods.  True or False Canned Vegetables have less sodium than froze True or False   WAYS TO DECREASE YOUR SODIUM INTAKE Avoid the use of added salt in cooking and at the table.  Table salt (and other prepared seasonings which contain salt) is probably one of the greatest sources of sodium in the diet.  Unsalted foods can gain flavor from the sweet, sour, and butter taste sensations of herbs and spices.  Instead of using salt for seasoning, try the following seasonings with the foods listed.   Remember: how you use them to enhance natural food flavors is limited only by your creativity... Allspice-Meat, fish, eggs, fruit, peas, red and yellow vegetables Almond Extract-Fruit baked goods Anise Seed-Sweet breads, fruit, carrots, beets, cottage cheese, cookies (tastes like licorice) Basil-Meat, fish, eggs, vegetables, rice, vegetables salads, soups, sauces Bay Leaf-Meat, fish, stews, poultry Burnet-Salad, vegetables (cucumber-like flavor) Caraway Seed-Bread, cookies, cottage cheese, meat, vegetables, cheese, rice Cardamon-Baked goods, fruit, soups Celery Powder or seed-Salads, salad dressings, sauces, meatloaf, soup, bread.Do not use  celery salt Chervil-Meats, salads, fish, eggs, vegetables, cottage cheese (parsley-like flavor) Chili Power-Meatloaf, chicken cheese, corn, eggplant, egg dishes Chives-Salads cottage cheese, egg dishes, soups, vegetables, sauces Cilantro-Salsa, casseroles Cinnamon-Baked goods, fruit, pork, lamb, chicken, carrots Cloves-Fruit, baked goods, fish, pot roast, green beans, beets, carrots Coriander-Pastry, cookies, meat, salads, cheese (lemon-orange flavor) Cumin-Meatloaf, fish,cheese, eggs, cabbage,fruit pie (caraway flavor) [employer], fruit, eggs, fish, poultry, cottage cheese, vegetables Dill Seed-Meat, cottage cheese, poultry, vegetables, fish, salads, bread Fennel Seed-Bread, cookies, apples, pork, eggs, fish, beets, cabbage, cheese, Licorice-like flavor Garlic-(buds or powder) Salads, meat, poultry, fish, bread, butter, vegetables, potatoes.Do not  use garlic salt Ginger-Fruit, vegetables, baked goods, meat, fish, poultry Horseradish Root-Meet, vegetables, butter Lemon Juice or Extract-Vegetables, fruit, tea, baked goods, fish salads Mace-Baked goods fruit, vegetables, fish, poultry (taste like nutmeg) Maple Extract-Syrups Marjoram-Meat, chicken, fish, vegetables, breads, green salads (taste like Sage) Mint-Tea, lamb, sherbet, vegetables,  desserts, carrots, cabbage Mustard, Dry or Seed-Cheese,  eggs, meats, vegetables, poultry Nutmeg-Baked goods, fruit, chicken, eggs, vegetables, desserts Onion Powder-Meat, fish, poultry, vegetables, cheese, eggs, bread, rice salads (Do not use   Onion salt) Orange Extract-Desserts, baked goods Oregano-Pasta, eggs, cheese, onions, pork, lamb, fish, chicken, vegetables, green salads Paprika-Meat, fish, poultry, eggs, cheese, vegetables Parsley Flakes-Butter, vegetables, meat fish, poultry, eggs, bread, salads (certain forms may   Contain sodium Pepper-Meat fish, poultry, vegetables, eggs Peppermint Extract-Desserts, baked goods Poppy Seed-Eggs, bread, cheese, fruit dressings, baked goods, noodles, vegetables, cottage  [employer], poultry, meat, fish, cauliflower, turnips,eggs bread Saffron-Rice, bread, veal, chicken, fish, eggs Sage-Meat, fish, poultry, onions, eggplant, tomateos, pork, stews Savory-Eggs, salads, poultry, meat, rice, vegetables, soups, pork Tarragon-Meat, poultry, fish, eggs, butter, vegetables (licorice-like flavor)  Thyme-Meat, poultry, fish, eggs, vegetables, (clover-like flavor), sauces, soups Tumeric-Salads, butter, eggs, fish, rice, vegetables (saffron-like flavor) Vanilla Extract-Baked goods, candy Vinegar-Salads, vegetables, meat marinades Walnut Extract-baked goods, candy   2. Choose your Foods Wisely   The following is a list of foods to avoid which are high in sodium:  Meats-Avoid all smoked, canned, salt cured, dried and kosher meat and fish as well as Anchovies   [employer] meats:Bologna, Liverwurst, Pastrami Canned meat or fish  Marinated herring Caviar    Pepperoni Corned Beef   Pizza Dried chipped beef  Salami Frozen breaded fish or meat Salt pork Frankfurters or hot dogs  Sardines Gefilte fish   Sausage Ham (boiled ham, Proscuitto Smoked butt    spiced ham)   Spam      TV  Dinners Vegetables Canned vegetables (Regular) Relish Canned mushrooms  Sauerkraut Olives    Tomato juice Pickles  Bakery and Dessert Products Canned puddings  Cream pies Cheesecake   Decorated cakes Cookies  Beverages/Juices Tomato juice, regular  Gatorade   V-8 vegetable juice, regular  Breads and Cereals Biscuit mixes   Salted potato chips, corn chips, pretzels Bread stuffing mixes  Salted crackers and rolls Pancake and waffle mixes Self-rising flour  Seasonings Accent    Meat sauces Barbecue sauce  Meat tenderizer Catsup    Monosodium glutamate (MSG) Celery salt   Onion salt Chili sauce   Prepared mustard Garlic salt   Salt, seasoned salt, sea salt Gravy mixes   Soy sauce Horseradish   Steak sauce Ketchup   Tartar sauce Lite salt    Teriyaki sauce Marinade mixes   Worcestershire sauce  Others Baking powder   Cocoa and cocoa mixes Baking soda   Commercial casserole mixes Candy-caramels, chocolate  Dehydrated soups    Bars, fudge,nougats  Instant rice and pasta mixes Canned broth or soup  Maraschino cherries Cheese, aged and processed cheese and cheese spreads  Learning Assessment Quiz  Indicated T (for True) or F (for False) for each of the following statements:  _____ Fresh fruits and vegetables and unprocessed grains are generally low in sodium _____ Water may contain a considerable amount of sodium, depending on the source _____ You can always tell if a food is high in sodium by tasting it _____ Certain laxatives my be high in sodium and should be avoided unless prescribed   by a physician or pharmacist _____ Salt substitutes may be used freely by anyone on a sodium restricted diet _____ Sodium is present in table salt, food additives and as a natural component of   most foods _____ Table salt is approximately 90% sodium _____ Limiting sodium intake may help prevent excess fluid accumulation in the body _____ On a sodium-restricted diet,  seasonings such as  bouillon soy sauce, and    cooking wine should be used in place of table salt _____ On an ingredient list, a product which lists monosodium glutamate as the first   ingredient is an appropriate food to include on a low sodium diet  Circle the best answer(s) to the following statements (Hint: there may be more than one correct answer)  11. On a low-sodium diet, some acceptable snack items are:    A. Olives  F. Bean dip   K. Grapefruit juice    B. Salted Pretzels G. Commercial Popcorn   L. Canned peaches    C. Carrot Sticks  H. Bouillon   M. Unsalted nuts   D. [place] fries  I. Peanut butter crackers N. Salami   E. Sweet pickles J. Tomato Juice   O. Pizza  12.  Seasonings that may be used freely on a reduced - sodium diet include   A. Lemon wedges F.Monosodium glutamate K. Celery seed    B.Soysauce   G. Pepper   L. Mustard powder   C. Sea salt  H. Cooking wine  M. Onion flakes   D. Vinegar  E. Prepared horseradish N. Salsa   E. Sage   J. Worcestershire sauce  O. Chutney

## 2022-10-27 ENCOUNTER — Encounter: Payer: Self-pay | Admitting: Cardiology

## 2022-10-27 ENCOUNTER — Ambulatory Visit: Payer: 59 | Attending: Cardiology | Admitting: Cardiology

## 2022-10-27 VITALS — BP 124/66 | HR 58 | Ht 71.0 in | Wt 300.2 lb

## 2022-10-27 DIAGNOSIS — R600 Localized edema: Secondary | ICD-10-CM

## 2022-10-27 NOTE — Progress Notes (Signed)
Cardiology Office Note  Date: 10/27/2022   ID: Brendan Ramirez, Brendan Ramirez 1946/06/15, MRN [id number]  PCP:  Celene Squibb, MD  Cardiologist:  Rozann Lesches, MD Electrophysiologist:  None   Chief Complaint  Patient presents with   Cardiac follow-up    History of Present Illness: Brendan Ramirez is a 77 y.o. male last seen in August 2023 by Ms. Vita Barley, I reviewed the note.  He is here for a routine visit.  States that in the interim he has worked on weight loss and has cut back several of his medications.  He is no longer on ethacrynic acid, uses compression stockings and his leg swelling is actually improved.  I went over his recent lab work which is noted below.  His LDL was 60 off Lipitor.  He had a visit with Dr. Nevada Crane in December 2023.  Blood pressure today is much better controlled.  Past Medical History:  Diagnosis Date   COPD (chronic obstructive pulmonary disease) (HCC)    Essential hypertension    Mixed hyperlipidemia    OSA (obstructive sleep apnea)    Prediabetes     Current Outpatient Medications  Medication Sig Dispense Refill   albuterol (VENTOLIN HFA) 108 (90 Base) MCG/ACT inhaler Inhale 1-2 puffs into the lungs as needed for wheezing or shortness of breath.     Coenzyme Q10 (CO Q-10) 200 MG CAPS Take 200 mg by mouth daily.     fluticasone (FLONASE) 50 MCG/ACT nasal spray Place 1 spray into both nostrils daily.     gabapentin (NEURONTIN) 300 MG capsule Take 300 mg by mouth 2 (two) times daily.     levocetirizine (XYZAL) 5 MG tablet Take 5 mg by mouth daily.     Multiple Vitamin (MULTIVITAMIN) tablet Take 1 tablet by mouth daily.     olmesartan (BENICAR) 40 MG tablet Take 40 mg by mouth daily.     Omega-3 Fatty Acids (FISH OIL) 1200 MG CAPS Take 1,200 mg by mouth 2 (two) times daily.     tadalafil (CIALIS) 10 MG tablet Take 10 mg by mouth daily as needed for erectile dysfunction.     No current facility-administered medications for this visit.   Allergies:  Sulfa  antibiotics   ROS: No palpitations or syncope.  Physical Exam: VS:  BP 124/66   Pulse (!) 58   Ht '5\' 11"'$  (1.803 m)   Wt (!) 300 lb 3.2 oz (136.2 kg)   SpO2 93%   BMI 41.87 kg/m , BMI Body mass index is 41.87 kg/m.  Wt Readings from Last 3 Encounters:  10/27/22 (!) 300 lb 3.2 oz (136.2 kg)  05/20/22 (!) 316 lb (143.3 kg)  11/27/21 (!) 317 lb 9.6 oz (144.1 kg)    General: Patient appears comfortable at rest. HEENT: Conjunctiva and lids normal. Neck: Supple, no elevated JVP or carotid bruits. Lungs: Clear to auscultation, nonlabored breathing at rest. Cardiac: Regular rate and rhythm, no S3, 1/6 systolic murmur. Extremities: Below the knee compression stockings in place.  ECG:  An ECG dated 11/27/2021 was personally reviewed today and demonstrated:  Sinus bradycardia.  Recent Labwork:  December 2023: Hemoglobin 13.3, platelets 226, BUN 22, creatinine 1.25, potassium 4.4, AST 26, ALT 21, cholesterol 113, triglycerides 174, HDL 23, LDL 60, hemoglobin A1c 5.6%  Other Studies Reviewed Today:  Echocardiogram 12/13/2021:  1. Left ventricular ejection fraction, by estimation, is 65 to 70%. The  left ventricle has normal function. The left ventricle has no regional  wall motion  abnormalities. There is moderate left ventricular hypertrophy.  Left ventricular diastolic  parameters are consistent with Grade I diastolic dysfunction (impaired  relaxation). Elevated left atrial pressure.   2. Right ventricular systolic function is normal. The right ventricular  size is normal. Tricuspid regurgitation signal is inadequate for assessing  PA pressure.   3. The mitral valve was not well visualized. Trivial mitral valve  regurgitation. No evidence of mitral stenosis.   4. The aortic valve has an indeterminant number of cusps. There is  moderate calcification of the aortic valve. There is moderate thickening  of the aortic valve. Aortic valve regurgitation is not visualized. Mild  aortic valve  stenosis.   5. Aortic dilatation noted. There is mild dilatation of the ascending  aorta, measuring 36 mm.   Assessment and Plan:  1.  History of bilateral leg edema, improved with interval weight loss and also use of compression stockings.  He is no longer on ethacrynic acid either.  LVEF 65 to 70% by last echocardiogram.  Would continue with observation and conservative measures for now.  Keep follow-up with PCP, we can see him back as needed.  2.  Essential hypertension, continues on olmesartan.  Blood pressure control is better today, 124/66.  Keep follow-up with PCP.  Medication Adjustments/Labs and Tests Ordered: Current medicines are reviewed at length with the patient today.  Concerns regarding medicines are outlined above.   Tests Ordered: No orders of the defined types were placed in this encounter.   Medication Changes: No orders of the defined types were placed in this encounter.   Disposition:  Follow up  as needed.  Signed, Satira Sark, MD, [facility] 10/27/2022 4:17 PM    [facility] [address], [place], [place] [address] Phone: [phone]; Fax: [phone]

## 2022-10-27 NOTE — Patient Instructions (Signed)
Medication Instructions:  Your physician recommends that you continue on your current medications as directed. Please refer to the Current Medication list given to you today.   Labwork: None today  Testing/Procedures: None today  Follow-Up: As needed  Any Other Special Instructions Will Be Listed Below (If Applicable).

## 2023-04-13 ENCOUNTER — Ambulatory Visit (INDEPENDENT_AMBULATORY_CARE_PROVIDER_SITE_OTHER): Payer: 59 | Admitting: Podiatry

## 2023-04-13 ENCOUNTER — Encounter: Payer: Self-pay | Admitting: Podiatry

## 2023-04-13 DIAGNOSIS — L600 Ingrowing nail: Secondary | ICD-10-CM

## 2023-04-13 NOTE — Patient Instructions (Signed)

## 2023-04-14 NOTE — Progress Notes (Signed)
Subjective:   Patient ID: Brendan Ramirez, male   DOB: 77 y.o.   MRN: [id number]   HPI Patient presents with chronic ingrown toenail deformities bilateral stating they have been very sore he has tried trimming soaking without relief of symptoms and is not able to do this anymore.  Patient does not smoke and tries to be active with moderate obesity noted   Review of Systems  All other systems reviewed and are negative.       Objective:  Physical Exam Vitals and nursing note reviewed.  Constitutional:      Appearance: He is well-developed.  Pulmonary:     Effort: Pulmonary effort is normal.  Musculoskeletal:        General: Normal range of motion.  Skin:    General: Skin is warm.  Neurological:     Mental Status: He is alert.     Neurovascular status intact muscle strength found to be adequate range of motion within normal limits with patient found to have incurvated medial borders of the hallux both feet that are painful when pressed and make shoe gear difficult.  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  Patient wants to have these fixed I do think it is in his best interest I allowed him to read and then signed consent form understanding risk and I infiltrated each big toe 60 mg Xylocaine Marcaine mixture sterile prep done using sterile instrumentation remove the hallux nail borders exposed matrix applied phenol 3 applications 30 seconds followed by alcohol lavage sterile dressing gave instructions on soaks wear dressings for 24 hours but take off earlier if throbbing were to occur and encouraged to call questions concerns

## 2023-07-01 ENCOUNTER — Ambulatory Visit (INDEPENDENT_AMBULATORY_CARE_PROVIDER_SITE_OTHER): Payer: 59 | Admitting: Audiology

## 2023-07-01 ENCOUNTER — Encounter (INDEPENDENT_AMBULATORY_CARE_PROVIDER_SITE_OTHER): Payer: Self-pay | Admitting: Otolaryngology

## 2023-07-01 ENCOUNTER — Ambulatory Visit (INDEPENDENT_AMBULATORY_CARE_PROVIDER_SITE_OTHER): Payer: 59 | Admitting: Otolaryngology

## 2023-07-01 VITALS — Ht 71.0 in | Wt 300.0 lb

## 2023-07-01 DIAGNOSIS — H903 Sensorineural hearing loss, bilateral: Secondary | ICD-10-CM | POA: Diagnosis not present

## 2023-07-01 NOTE — Progress Notes (Signed)
[facility] [address] [place], [place] [address]   Audiological Evaluation    History: Brendan Ramirez was referred today for a hearing evaluation by Dr. Janeece Riggers Philomena Doheny. He was last seen at [facility] for an audiological evaluation on October 4th, 2023 due to concerns of hearing difficulty. He is a current hearing aid user.   Tympanogram: Right ear: Normal external ear canal volume with normal middle ear pressure and tympanic membrane compliance (Type A). Left ear: Normal external ear canal volume with normal middle ear pressure and tympanic membrane compliance (Type A).   Hearing Evaluation: The audiogram was completed using conventional audiometric techniques under headphones with good reliability.   The hearing test results indicate: Right ear: Mild sensorineural hearing loss at 250 Hz rising to normal hearing sensitivity from [phone] Hz gradually sloping to severe sensorineural hearing loss from 1500-8000 Hz. Left ear: Moderate sensorineural hearing loss from [phone] Hz gradually sloping to profound sensorineural hearing loss from 1500-8000 Hz. There is a significant difference between the ears for pure tone thresholds.   Speech Recognition Thresholds were obtained at 25 dBHL in the right ear and 60 dBHL masked in the left ear.   Word Recognition Testing was completed at 65 dBHL in the right ear and at 85 dBHL in the left ear with 55 dBHL of masking noise. The patient scored 92% in the right ear and 60% in the left ear.   Recommendations: Repeat audiogram when changes are perceived or per MD. Continue use of hearing aids.   Brendan Ramirez, AUD, CCC-A 07/01/2023

## 2023-07-01 NOTE — Progress Notes (Signed)
Patient ID: Brendan Ramirez, male   DOB: 1945/12/13, 77 y.o.   MRN: [id number]  Cc: Progressive hearing loss  HPI: The patient is a 77 year old male who returns today for his follow-up evaluation.  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  His brain MRI scan was negative.  He was fitted with bilateral hearing aids.  According to the patient, he continues to have hearing difficulty, especially in noisy environments.  He denies any otalgia, otorrhea, or vertigo.  His previously noted eustachian tube dysfunction has resolved.  Exam: General: Communicates without difficulty, well nourished, no acute distress. Head: Normocephalic, no evidence injury, no tenderness, facial buttresses intact without stepoff. Face/sinus: No tenderness to palpation and percussion. Facial movement is normal and symmetric. Eyes: PERRL, EOMI. No scleral icterus, conjunctivae clear. Neuro: CN II exam reveals vision grossly intact.  No nystagmus at any point of gaze. EAC: The ear canals and TMs are noted to be normal. Nose: External evaluation reveals normal support and skin without lesions.  Dorsum is intact.  Anterior rhinoscopy reveals pink mucosa over anterior aspect of inferior turbinates and intact septum.  No purulence noted. Oral:  Oral cavity and oropharynx are intact, symmetric, without erythema or edema.  Mucosa is moist without lesions. Neck: Full range of motion without pain.  There is no significant lymphadenopathy.  No masses palpable.  Thyroid bed within normal limits to palpation.  Parotid glands and submandibular glands equal bilaterally without mass.  Trachea is midline. Neuro:  CN 2-12 grossly intact. Gait normal.   Assessment: Stable bilateral high frequency sensorineural hearing loss, worse on the left side.  The patient is a close, tenderness, and expresses abnormal. His previous MRI scan was negative for retrocochlear lesion.  Plan: 1.  The physical exam  findings and the hearing test results were reviewed with the patient. 2.  Continue daily use of bilateral hearing aids. 3.  The patient will return for reevaluation in 1 year, sooner if needed.

## 2023-07-07 ENCOUNTER — Ambulatory Visit (INDEPENDENT_AMBULATORY_CARE_PROVIDER_SITE_OTHER): Payer: 59 | Admitting: Audiology

## 2023-07-07 DIAGNOSIS — H903 Sensorineural hearing loss, bilateral: Secondary | ICD-10-CM

## 2023-07-07 NOTE — Progress Notes (Signed)
Patient was seen today for a hearing aid reprogramming. Replaced his right receiver, domes, and wax filters. He noticed an increase in volume after the cleaning. Connected the hearing aids to the software and updated the audiogram. Changed him to [facility] Digital Contrast 2.0 to help with speech clarity. Set his occlusion manager to strong to help with his own voice. Decreased his high frequencies 1 step to help with the sharpness of sounds. He was happy with the sound volume, clarity, and balance between his ears. Instructed him to call if any concerns arise.  Conley Rolls Dashley Monts, AUD  07/07/23

## 2023-08-13 ENCOUNTER — Ambulatory Visit (INDEPENDENT_AMBULATORY_CARE_PROVIDER_SITE_OTHER): Payer: Self-pay | Admitting: Audiology

## 2023-08-13 NOTE — Progress Notes (Signed)
Patient was seen today for a hearing aid check. He reported feedback in his left hearing aid and overall too much treble. 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 Increased his low gain 3 steps in the left hearing aid to help with the balance between his ears. Increased the overall intelligibility across the frequencies to help with the clarity. Increased the noise cancellation and set the microphones to a directional fit to help with background noise. Gave him extra domes to try if needed. He was happy with the changes. Also discussed how to clean the charger ports to improve his hearing aid charges. Instructed him to call if any concerns arise.   Conley Rolls Camera Krienke, AUD, CCC-A 08/13/23

## 2023-08-19 IMAGING — US US EXTREM LOW VENOUS*L*
1 series · 14 of 24 positions shown · non-contrast
Comparison: None.

CLINICAL DATA: Left leg swelling

EXAM:
LEFT LOWER EXTREMITY VENOUS DOPPLER ULTRASOUND
TECHNIQUE: Gray-scale sonography with compression, as well as color and duplex
ultrasound, were performed to evaluate the deep venous system(s)
from the level of the common femoral vein through the popliteal and
proximal calf veins.

[Series 1: us venous img lower uni left (dvt) · portal-venous · 14 of 45 slices shown]
[im 1/45]
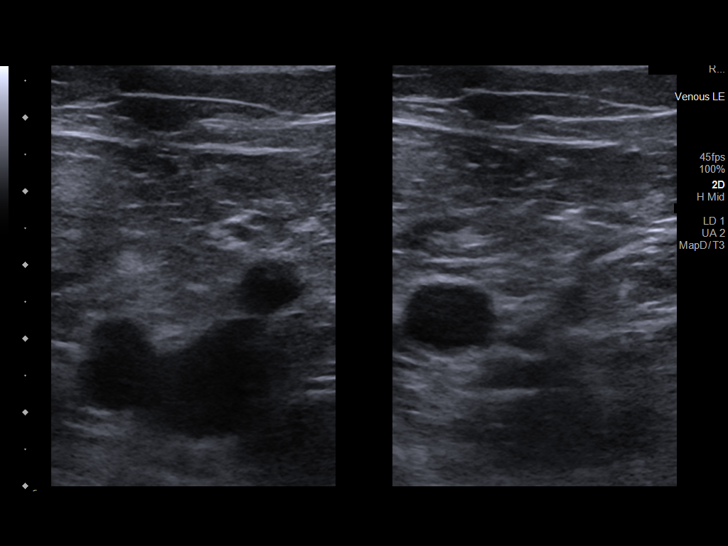
[im 4/45]
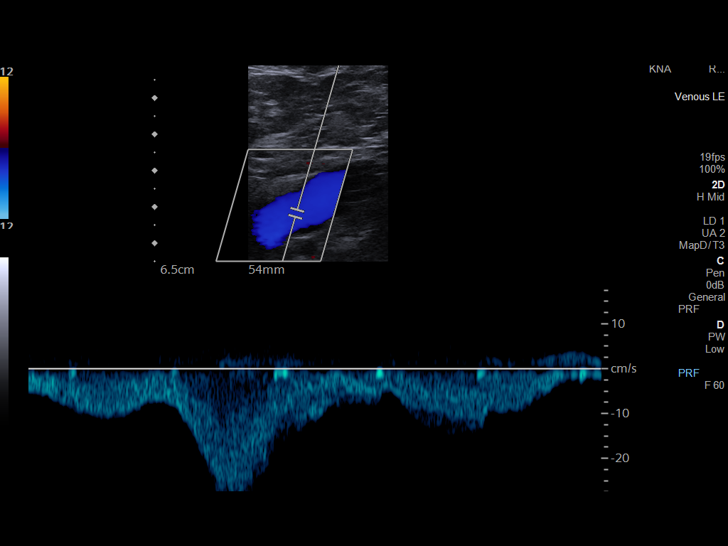
[im 8/45]
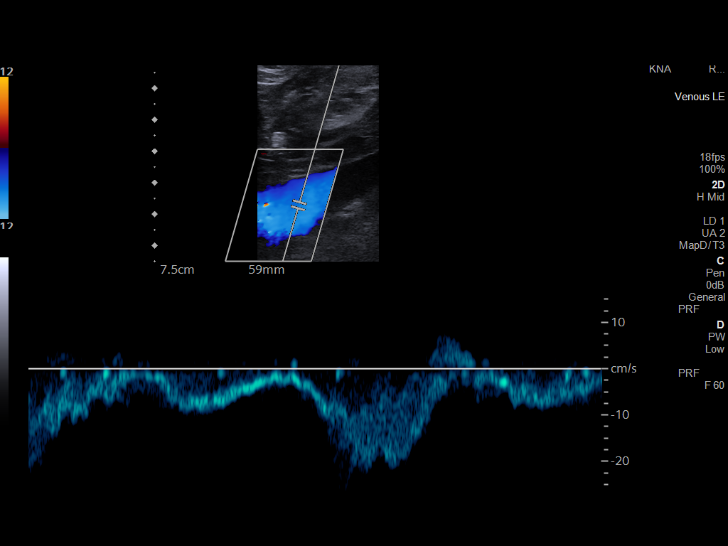
[im 12/45]
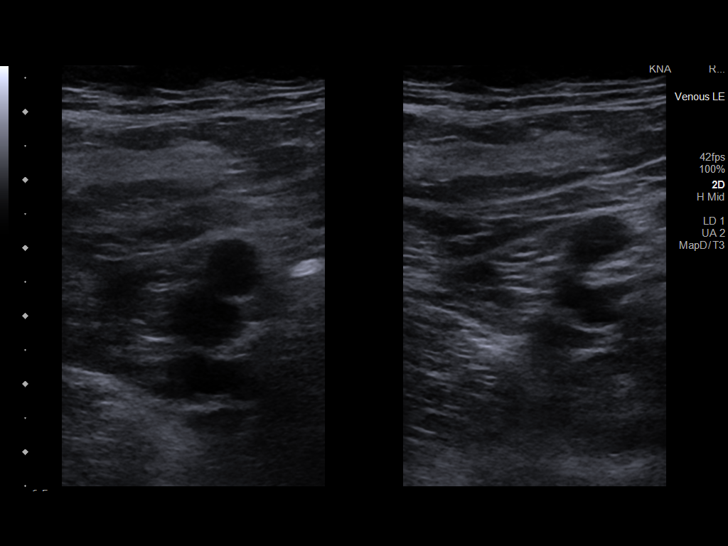
[im 14/45]
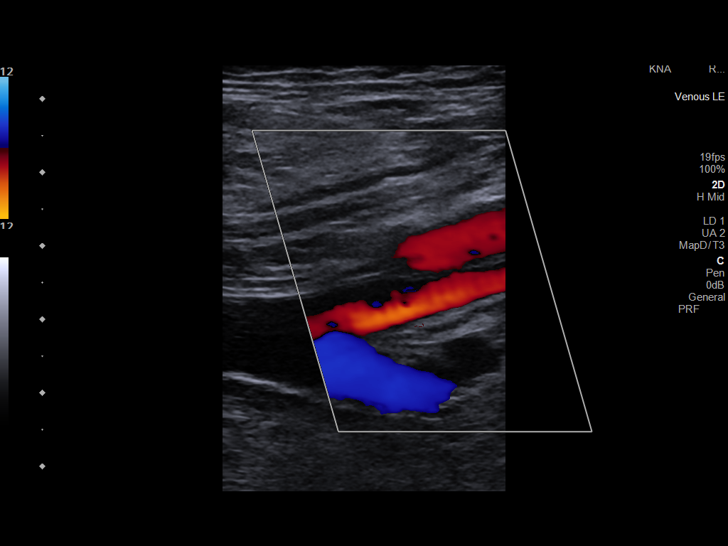
[im 18/45]
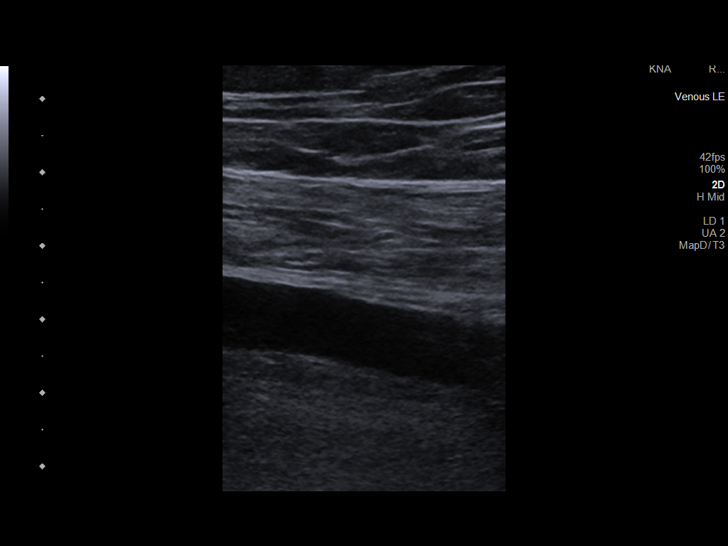
[im 22/45]
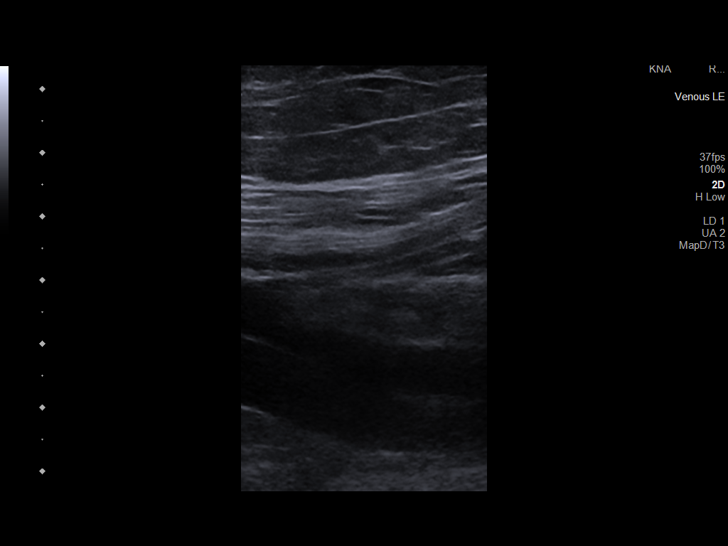
[im 23/45]
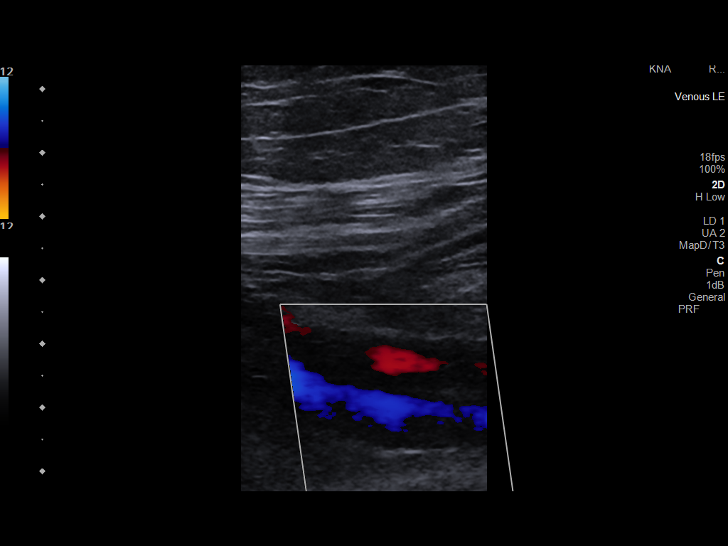
[im 27/45]
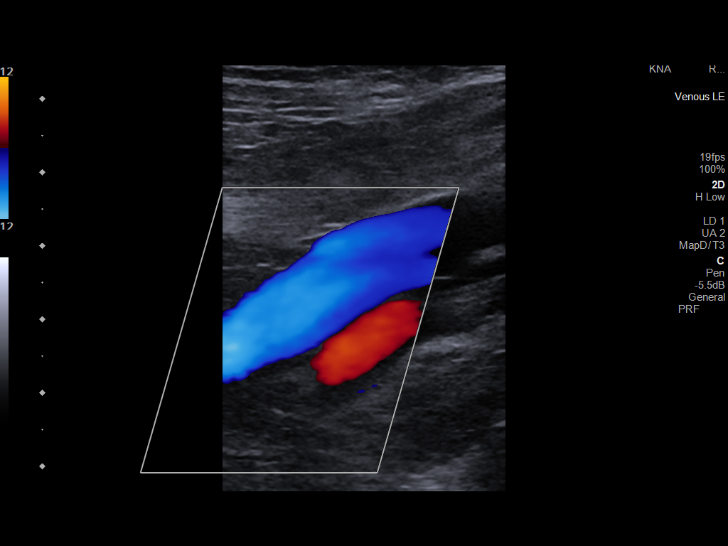
[im 31/45]
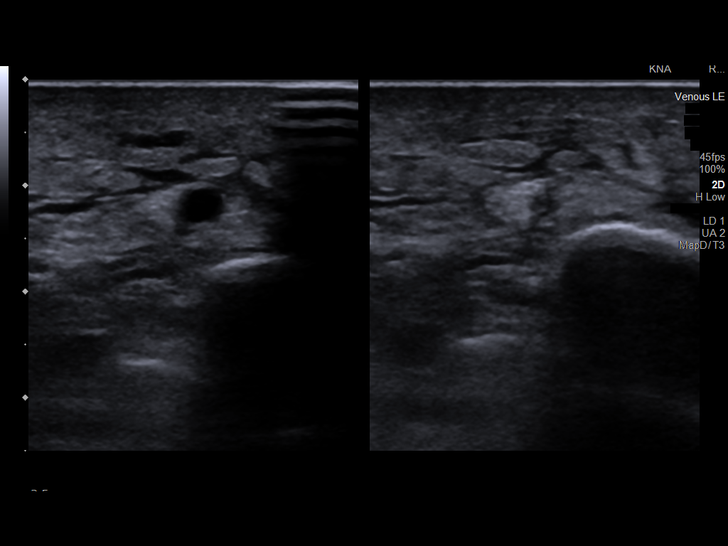
[im 35/45]
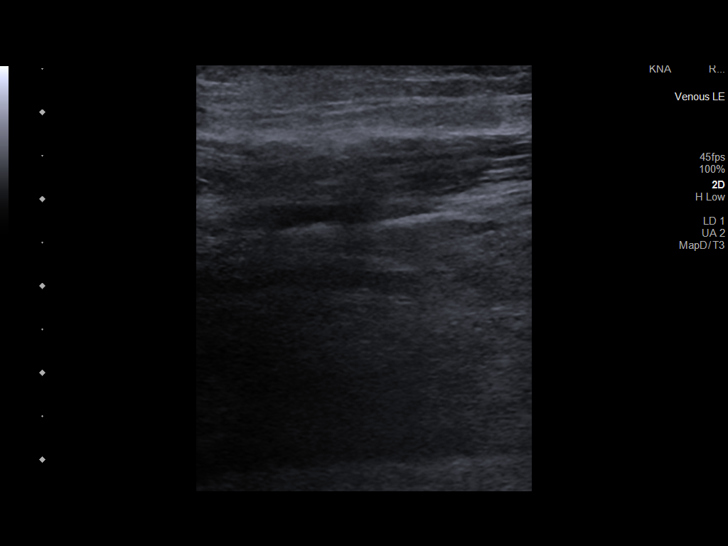
[im 37/45]
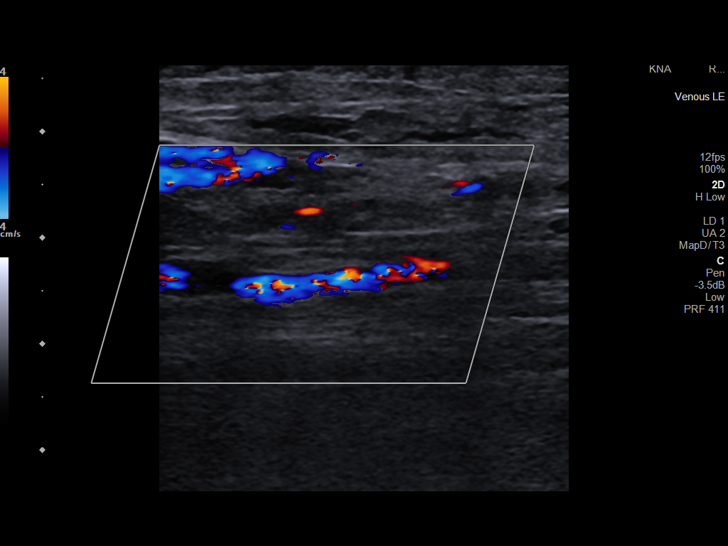
[im 41/45]
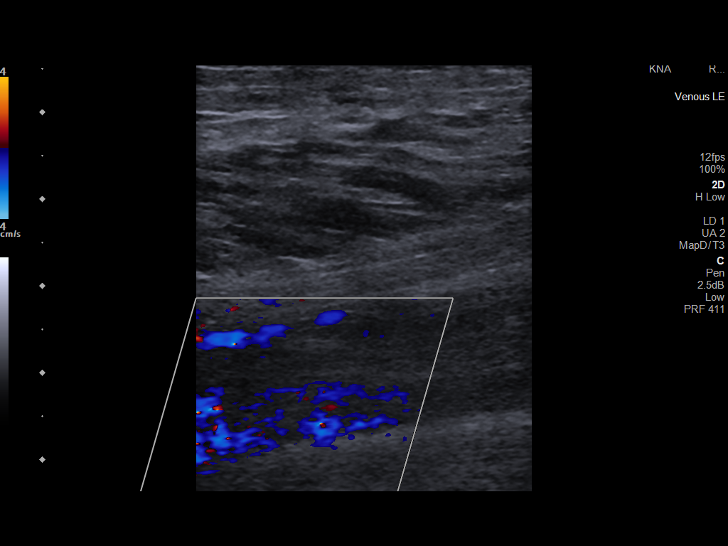
[im 45/45]
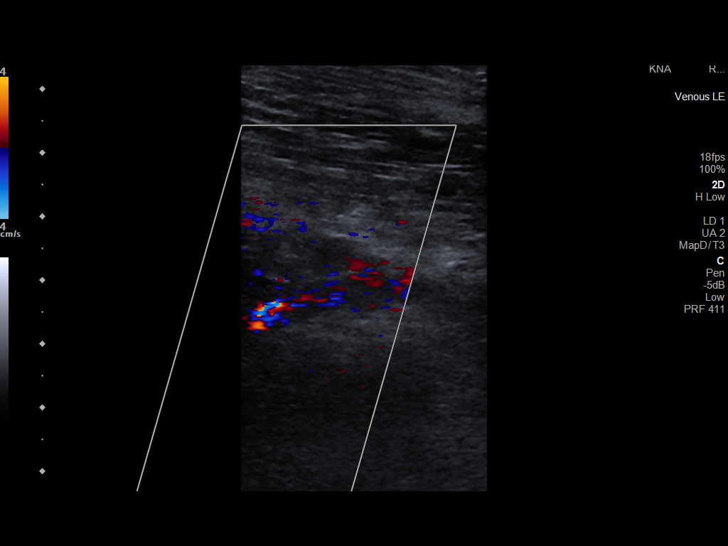

[14 of 24 positions shown; findings below may reference images not displayed]

FINDINGS: VENOUS

Normal compressibility of the common femoral, superficial femoral,
and popliteal veins, as well as the visualized calf veins.
Visualized portions of profunda femoral vein and great saphenous
vein unremarkable. No filling defects to suggest DVT on grayscale or
color Doppler imaging. Doppler waveforms show normal direction of
venous flow, normal respiratory plasticity and response to
augmentation.

Limited views of the contralateral common femoral vein are
unremarkable.

Limitations: Limited study due to patient body habitus and edema.
IMPRESSION: No evidence of DVT in the left lower extremity. Limited study due to
patient body habitus and edema.

## 2023-08-25 IMAGING — DX DG CHEST 2V
2 series · 2 of 2 positions shown · non-contrast
Comparison: Remote chest CT 12/21/2007

CLINICAL DATA: Dyspnea on exertion.  Symptoms for months.

EXAM:
CHEST - 2 VIEW

[chest pa]
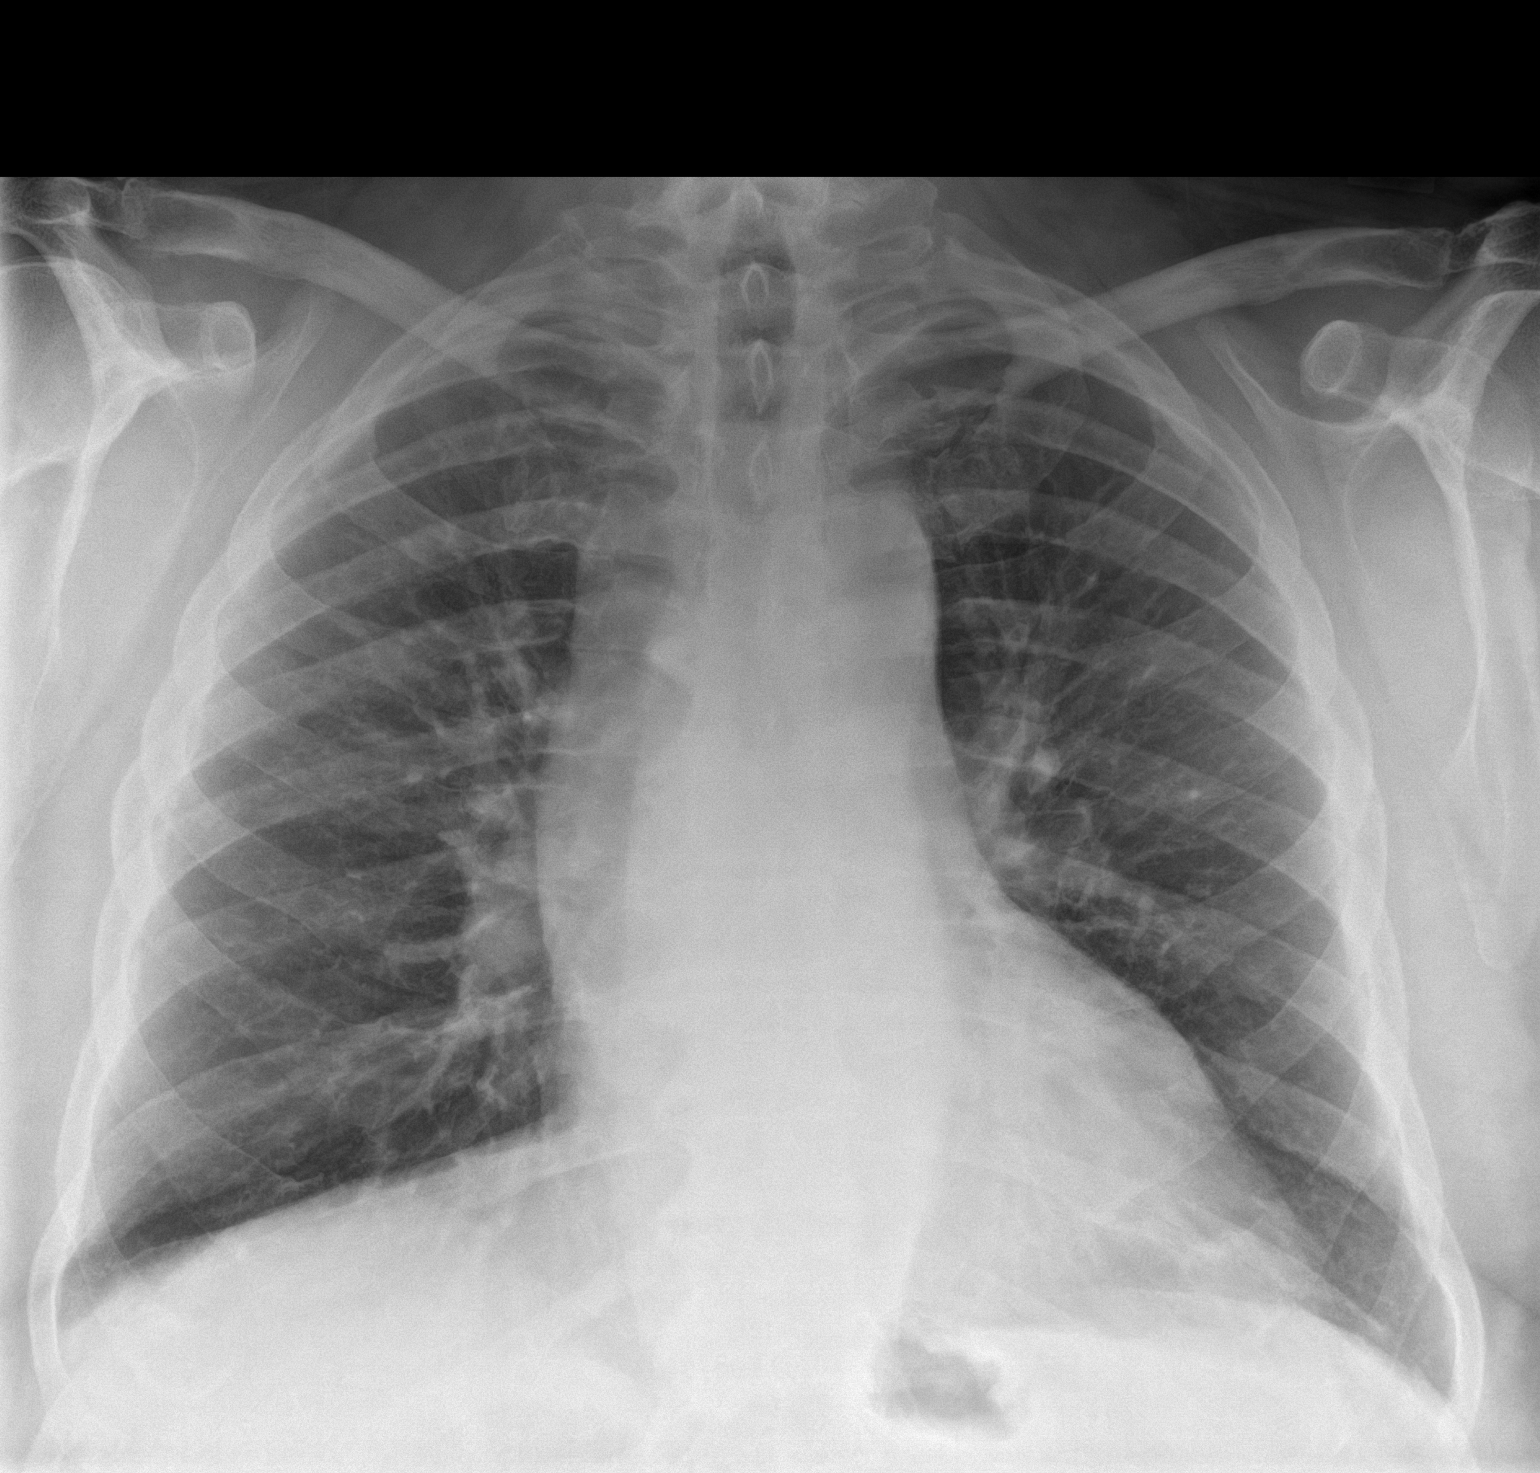

[chest lat]
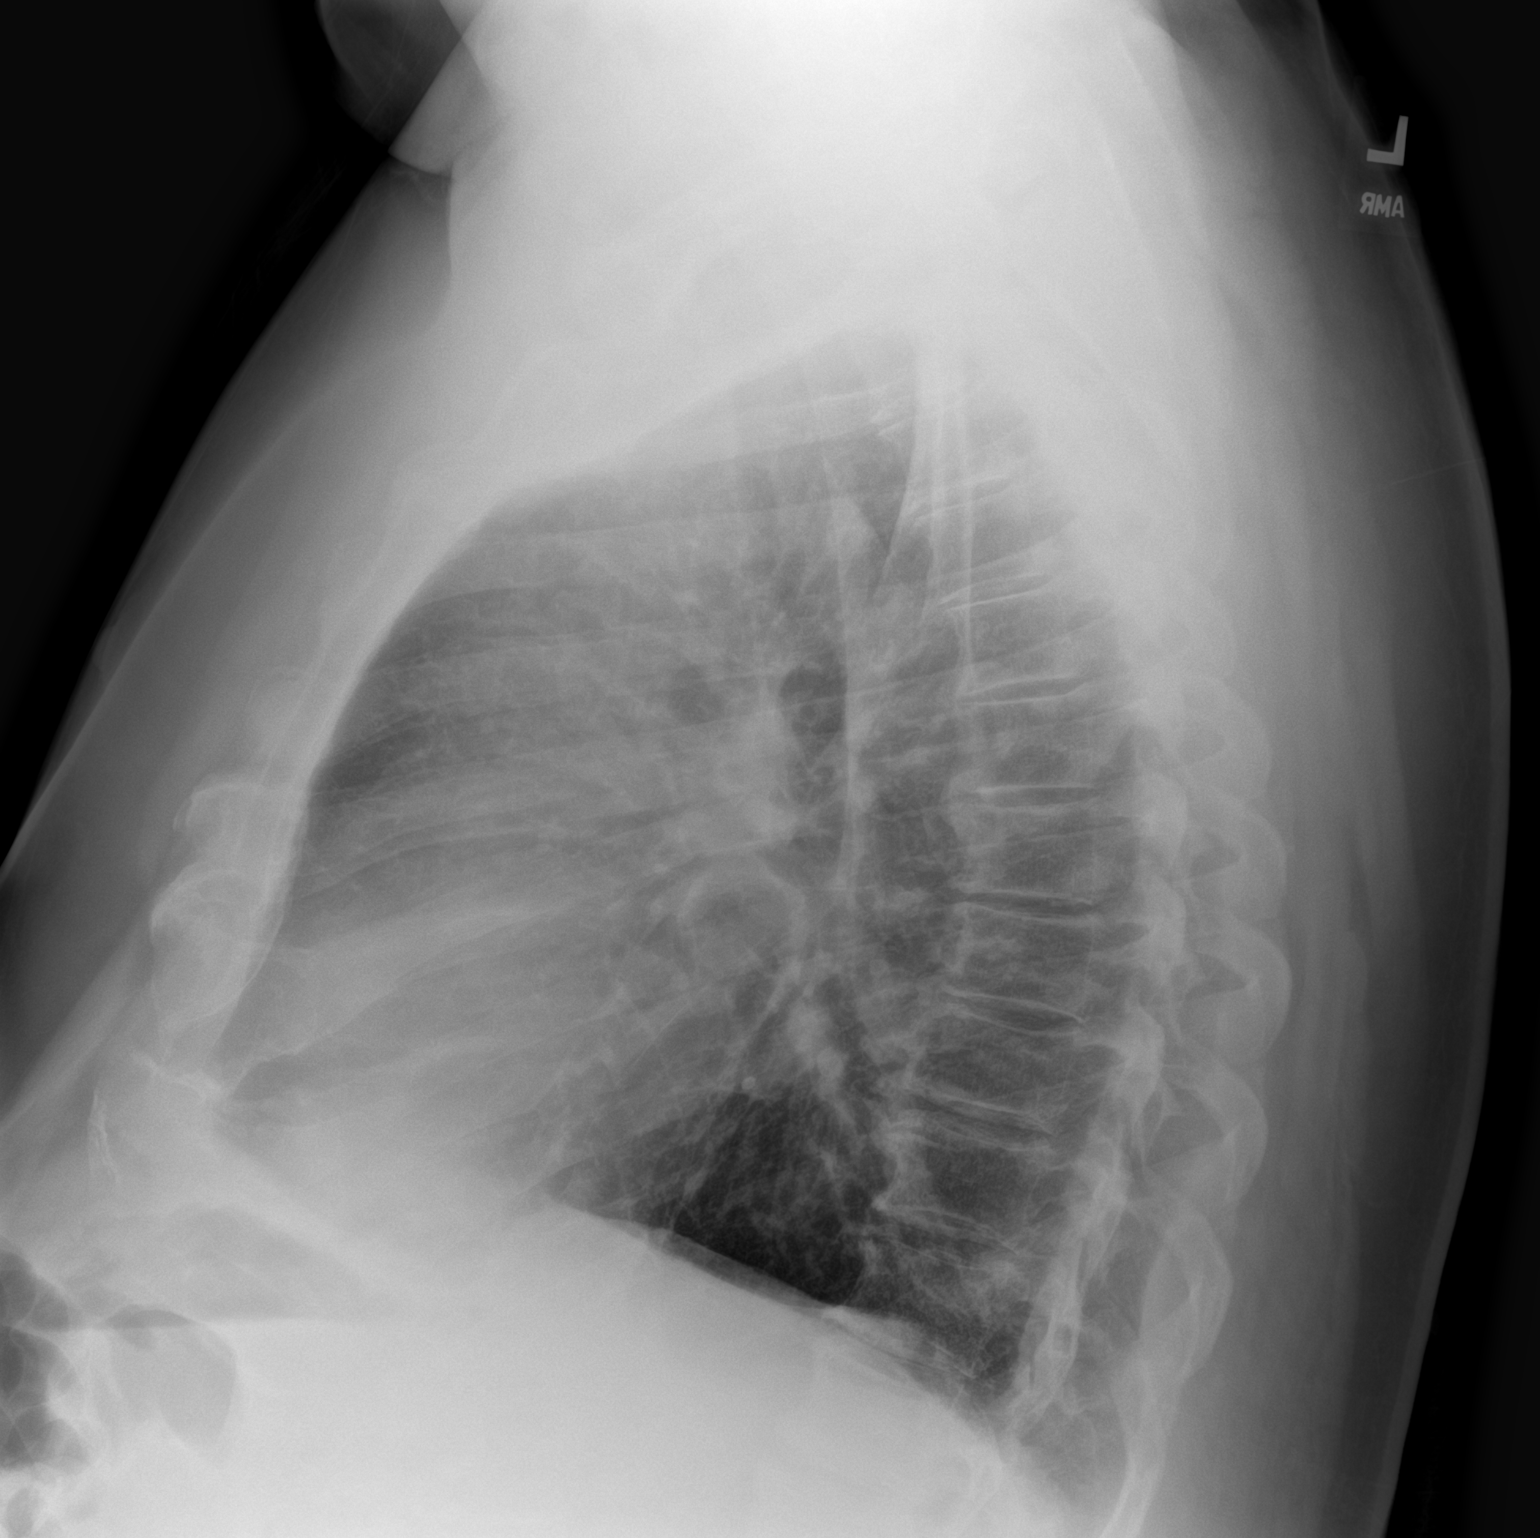

[2 of 2 positions shown; findings below may reference images not displayed]

FINDINGS: Heart is upper normal in size. Normal mediastinal contours. Normal
pulmonary vasculature. Mild pulmonary hyperinflation. No confluent
airspace disease, pleural effusion, or pneumothorax. No acute
osseous abnormalities are seen.
IMPRESSION: Mild hyperinflation which may be related to bronchitis, asthma, or
smoking history.

## 2024-01-13 ENCOUNTER — Other Ambulatory Visit: Payer: Self-pay

## 2024-03-02 ENCOUNTER — Ambulatory Visit (INDEPENDENT_AMBULATORY_CARE_PROVIDER_SITE_OTHER): Admitting: Podiatry

## 2024-03-02 ENCOUNTER — Ambulatory Visit: Admitting: Podiatry

## 2024-03-02 ENCOUNTER — Encounter: Payer: Self-pay | Admitting: Podiatry

## 2024-03-02 VITALS — Ht 71.0 in | Wt 300.0 lb

## 2024-03-02 DIAGNOSIS — L600 Ingrowing nail: Secondary | ICD-10-CM | POA: Diagnosis not present

## 2024-03-02 NOTE — Patient Instructions (Signed)

## 2024-03-02 NOTE — Progress Notes (Signed)
 Subjective:   Patient ID: Brendan Ramirez, male   DOB: 78 y.o.   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

## 2024-04-28 ENCOUNTER — Encounter (INDEPENDENT_AMBULATORY_CARE_PROVIDER_SITE_OTHER): Payer: Self-pay | Admitting: *Deleted

## 2024-05-11 ENCOUNTER — Encounter (INDEPENDENT_AMBULATORY_CARE_PROVIDER_SITE_OTHER): Payer: Self-pay | Admitting: *Deleted

## 2024-05-19 ENCOUNTER — Encounter (INDEPENDENT_AMBULATORY_CARE_PROVIDER_SITE_OTHER): Payer: Self-pay | Admitting: *Deleted

## 2024-06-13 ENCOUNTER — Telehealth (INDEPENDENT_AMBULATORY_CARE_PROVIDER_SITE_OTHER): Payer: Self-pay | Admitting: Gastroenterology

## 2024-06-13 ENCOUNTER — Encounter (INDEPENDENT_AMBULATORY_CARE_PROVIDER_SITE_OTHER): Payer: Self-pay | Admitting: Gastroenterology

## 2024-06-13 ENCOUNTER — Ambulatory Visit (INDEPENDENT_AMBULATORY_CARE_PROVIDER_SITE_OTHER): Admitting: Gastroenterology

## 2024-06-13 VITALS — BP 131/85 | HR 83 | Temp 97.7°F | Ht 71.0 in | Wt 304.0 lb

## 2024-06-13 DIAGNOSIS — K649 Unspecified hemorrhoids: Secondary | ICD-10-CM

## 2024-06-13 DIAGNOSIS — K625 Hemorrhage of anus and rectum: Secondary | ICD-10-CM | POA: Insufficient documentation

## 2024-06-13 DIAGNOSIS — Z8719 Personal history of other diseases of the digestive system: Secondary | ICD-10-CM

## 2024-06-13 DIAGNOSIS — Z860100 Personal history of colon polyps, unspecified: Secondary | ICD-10-CM

## 2024-06-13 NOTE — Progress Notes (Signed)
 Toribio Fortune, M.D. Gastroenterology & Hepatology [facility] Gastroenterology [address] [place], [place] [address] Primary Care Physician: Shona Norleen PEDLAR, MD [address] [place] [address]  Referring MD: PCP  Chief Complaint: Rectal bleeding  History of Present Illness: Brendan Ramirez is a 78 y.o. male with past medical history of hemorrhoids, COPD, hypertension, hyperlipidemia, OSA, prediabetes, who presents for evaluation of rectal bleeding.  Patient reports that he has had intermittent episodes of rectal bleeding for multiple years due to hemorrhoids. He is having rectal bleeding every couple of months, which stops after a couple of days. He may have some burning sensation and itching when the hemorrhoids are active. Last bleeding event was 1-2 weeks ago. Does not strain too much to move his bowels. Does not take anything to move his bowels He may have 2-3 bowel movements per day.  The patient denies having any nausea, vomiting, fever, chills, hematochezia, melena, hematemesis, abdominal distention, abdominal pain, diarrhea, jaundice, pruritus or weight loss.  Last ZHI:wzczm Last Colonoscopy: 06/23/2019 - One diminutive polyp at the hepatic flexure. Biopsied. - Two small polyps in the distal sigmoid colon and in the transverse colon, removed with a cold snare. Resected and retrieved. - External hemorrhoids.  Patient had 3 polyps removed.  1 or 2 polyps are tubular adenomas and the third polyp is sessile serrated polyp.  Recommended repeat colonoscopy in 5 years  FHx: neg for any gastrointestinal/liver disease, mother may have had cancer Social: former smoking quit 40 years ago, neg alcohol or illicit drug use Surgical: no abdominal surgeries  Past Medical History: Past Medical History:  Diagnosis Date   COPD (chronic obstructive pulmonary disease) (HCC)    Essential hypertension    Mixed hyperlipidemia    OSA (obstructive sleep apnea)     Prediabetes     Past Surgical History: Past Surgical History:  Procedure Laterality Date   COLONOSCOPY N/A 06/23/2019   Procedure: COLONOSCOPY;  Surgeon: Golda Claudis PENNER, MD;  [facility];  Service: Endoscopy;  Laterality: N/A;  930   Left total arthroplasty  2002   POLYPECTOMY  06/23/2019   Procedure: POLYPECTOMY;  Surgeon: Golda Claudis PENNER, MD;  Location: [facility];  Service: Endoscopy;;  colon    Family History: Family History  Problem Relation Age of Onset   Colon cancer Other     Social History: Social History   Tobacco Use  Smoking Status Former   Types: Cigarettes   Start date: 12/12/1983  Smokeless Tobacco Never   Social History   Substance and Sexual Activity  Alcohol Use Yes   Comment: Socially   Social History   Substance and Sexual Activity  Drug Use No    Allergies: Allergies  Allergen Reactions   Sulfa Antibiotics     As a baby     Medications: Current Outpatient Medications  Medication Sig Dispense Refill   albuterol  (VENTOLIN  HFA) 108 (90 Base) MCG/ACT inhaler Inhale 1-2 puffs into the lungs as needed for wheezing or shortness of breath.     fluticasone (FLONASE) 50 MCG/ACT nasal spray Place 1 spray into both nostrils daily.     gabapentin (NEURONTIN) 300 MG capsule Take 300 mg by mouth 2 (two) times daily.     hydrochlorothiazide (HYDRODIURIL) 12.5 MG tablet Take 12.5 mg by mouth daily.     levocetirizine (XYZAL) 5 MG tablet Take 5 mg by mouth daily.     olmesartan (BENICAR) 40 MG tablet Take 40 mg  by mouth daily.     Coenzyme Q10 (CO Q-10) 200 MG CAPS Take 200 mg by mouth daily. (Patient not taking: Reported on 06/13/2024)     Multiple Vitamin (MULTIVITAMIN) tablet Take 1 tablet by mouth daily. (Patient not taking: Reported on 06/13/2024)     Omega-3 Fatty Acids (FISH OIL) 1200 MG CAPS Take 1,200 mg by mouth 2 (two) times daily. (Patient not taking: Reported on 06/13/2024)     tadalafil (CIALIS) 10 MG tablet Take 10 mg by mouth  daily as needed for erectile dysfunction. (Patient not taking: Reported on 06/13/2024)     No current facility-administered medications for this visit.    Review of Systems: GENERAL: negative for malaise, night sweats HEENT: No changes in hearing or vision, no nose bleeds or other nasal problems. NECK: Negative for lumps, goiter, pain and significant neck swelling RESPIRATORY: Negative for cough, wheezing CARDIOVASCULAR: Negative for chest pain, leg swelling, palpitations, orthopnea GI: SEE HPI MUSCULOSKELETAL: Negative for joint pain or swelling, back pain, and muscle pain. SKIN: Negative for lesions, rash PSYCH: Negative for sleep disturbance, mood disorder and recent psychosocial stressors. HEMATOLOGY Negative for prolonged bleeding, bruising easily, and swollen nodes. ENDOCRINE: Negative for cold or heat intolerance, polyuria, polydipsia and goiter. NEURO: negative for tremor, gait imbalance, syncope and seizures. The remainder of the review of systems is noncontributory.   Physical Exam: BP 131/85 (BP Location: Left Arm, Patient Position: Sitting, Cuff Size: Large)   Pulse 83   Temp 97.7 F (36.5 C) (Temporal)   Ht 5' 11 (1.803 m)   Wt (!) 304 lb (137.9 kg)   BMI 42.40 kg/m  GENERAL: The patient is AO x3, in no acute distress. HEENT: Head is normocephalic and atraumatic. EOMI are intact. Mouth is well hydrated and without lesions. NECK: Supple. No masses LUNGS: Clear to auscultation. No presence of rhonchi/wheezing/rales. Adequate chest expansion HEART: RRR, normal s1 and s2. ABDOMEN: Soft, nontender, no guarding, no peritoneal signs, and nondistended. BS +. No masses. EXTREMITIES: Without any cyanosis, clubbing, rash, lesions or edema. NEUROLOGIC: AOx3, no focal motor deficit. SKIN: no jaundice, no rashes   Imaging/Labs: as above  I personally reviewed and interpreted the available labs, imaging and endoscopic files.  Impression and Plan: Brendan Ramirez is a 78  y.o. male with past medical history of hemorrhoids, COPD, hypertension, hyperlipidemia, OSA, prediabetes, who presents for evaluation of rectal bleeding.  The patient has presented intermittent issues with rectal bleeding and perianal discomfort in the setting of history of external hemorrhoids.  However, these symptoms have been intermittent and has not present any other red flag signs.  We discussed about importance of trying stool bulking agents to improve his bowel movement frequency and decrease straining, which he is in agreement to proceed with.  He may take some topical and hemorrhoidal medication if he presents any recurrent rectal bleeding issues.  He is due for colonoscopy given his history of colon polyps, which will be scheduled today.  -Schedule colonoscopy If presenting recurrent bleeding, can use topical Preparation H cream to improve the bleeding -Start Benefiber fiber supplements 1 to 2 tablespoons daily to increase stool bulk  All questions were answered.      Toribio Fortune, MD Gastroenterology and Hepatology [facility]

## 2024-06-13 NOTE — H&P (View-Only) (Signed)
 Toribio Fortune, M.D. Gastroenterology & Hepatology [facility] Gastroenterology [address] [place], [place] [address] Primary Care Physician: Shona Norleen PEDLAR, MD [address] [place] [address]  Referring MD: PCP  Chief Complaint: Rectal bleeding  History of Present Illness: Brendan Ramirez is a 78 y.o. male with past medical history of hemorrhoids, COPD, hypertension, hyperlipidemia, OSA, prediabetes, who presents for evaluation of rectal bleeding.  Patient reports that he has had intermittent episodes of rectal bleeding for multiple years due to hemorrhoids. He is having rectal bleeding every couple of months, which stops after a couple of days. He may have some burning sensation and itching when the hemorrhoids are active. Last bleeding event was 1-2 weeks ago. Does not strain too much to move his bowels. Does not take anything to move his bowels He may have 2-3 bowel movements per day.  The patient denies having any nausea, vomiting, fever, chills, hematochezia, melena, hematemesis, abdominal distention, abdominal pain, diarrhea, jaundice, pruritus or weight loss.  Last ZHI:wzczm Last Colonoscopy: 06/23/2019 - One diminutive polyp at the hepatic flexure. Biopsied. - Two small polyps in the distal sigmoid colon and in the transverse colon, removed with a cold snare. Resected and retrieved. - External hemorrhoids.  Patient had 3 polyps removed.  1 or 2 polyps are tubular adenomas and the third polyp is sessile serrated polyp.  Recommended repeat colonoscopy in 5 years  FHx: neg for any gastrointestinal/liver disease, mother may have had cancer Social: former smoking quit 40 years ago, neg alcohol or illicit drug use Surgical: no abdominal surgeries  Past Medical History: Past Medical History:  Diagnosis Date   COPD (chronic obstructive pulmonary disease) (HCC)    Essential hypertension    Mixed hyperlipidemia    OSA (obstructive sleep apnea)     Prediabetes     Past Surgical History: Past Surgical History:  Procedure Laterality Date   COLONOSCOPY N/A 06/23/2019   Procedure: COLONOSCOPY;  Surgeon: Golda Claudis PENNER, MD;  [facility]: [facility];  Service: Endoscopy;  Laterality: N/A;  930   Left total arthroplasty  2002   POLYPECTOMY  06/23/2019   Procedure: POLYPECTOMY;  Surgeon: Golda Claudis PENNER, MD;  [facility]: [facility];  Service: Endoscopy;;  colon    Family History: Family History  Problem Relation Age of Onset   Colon cancer Other     Social History: Social History   Tobacco Use  Smoking Status Former   Types: Cigarettes   Start date: 12/12/1983  Smokeless Tobacco Never   Social History   Substance and Sexual Activity  Alcohol Use Yes   Comment: Socially   Social History   Substance and Sexual Activity  Drug Use No    Allergies: Allergies  Allergen Reactions   Sulfa Antibiotics     As a baby     Medications: Current Outpatient Medications  Medication Sig Dispense Refill   albuterol  (VENTOLIN  HFA) 108 (90 Base) MCG/ACT inhaler Inhale 1-2 puffs into the lungs as needed for wheezing or shortness of breath.     fluticasone (FLONASE) 50 MCG/ACT nasal spray Place 1 spray into both nostrils daily.     gabapentin (NEURONTIN) 300 MG capsule Take 300 mg by mouth 2 (two) times daily.     hydrochlorothiazide (HYDRODIURIL) 12.5 MG tablet Take 12.5 mg by mouth daily.     levocetirizine (XYZAL) 5 MG tablet Take 5 mg by mouth daily.     olmesartan (BENICAR) 40 MG tablet Take 40 mg  by mouth daily.     Coenzyme Q10 (CO Q-10) 200 MG CAPS Take 200 mg by mouth daily. (Patient not taking: Reported on 06/13/2024)     Multiple Vitamin (MULTIVITAMIN) tablet Take 1 tablet by mouth daily. (Patient not taking: Reported on 06/13/2024)     Omega-3 Fatty Acids (FISH OIL) 1200 MG CAPS Take 1,200 mg by mouth 2 (two) times daily. (Patient not taking: Reported on 06/13/2024)     tadalafil (CIALIS) 10 MG tablet Take 10 mg by mouth  daily as needed for erectile dysfunction. (Patient not taking: Reported on 06/13/2024)     No current facility-administered medications for this visit.    Review of Systems: GENERAL: negative for malaise, night sweats HEENT: No changes in hearing or vision, no nose bleeds or other nasal problems. NECK: Negative for lumps, goiter, pain and significant neck swelling RESPIRATORY: Negative for cough, wheezing CARDIOVASCULAR: Negative for chest pain, leg swelling, palpitations, orthopnea GI: SEE HPI MUSCULOSKELETAL: Negative for joint pain or swelling, back pain, and muscle pain. SKIN: Negative for lesions, rash PSYCH: Negative for sleep disturbance, mood disorder and recent psychosocial stressors. HEMATOLOGY Negative for prolonged bleeding, bruising easily, and swollen nodes. ENDOCRINE: Negative for cold or heat intolerance, polyuria, polydipsia and goiter. NEURO: negative for tremor, gait imbalance, syncope and seizures. The remainder of the review of systems is noncontributory.   Physical Exam: BP 131/85 (BP Location: Left Arm, Patient Position: Sitting, Cuff Size: Large)   Pulse 83   Temp 97.7 F (36.5 C) (Temporal)   Ht 5' 11 (1.803 m)   Wt (!) 304 lb (137.9 kg)   BMI 42.40 kg/m  GENERAL: The patient is AO x3, in no acute distress. HEENT: Head is normocephalic and atraumatic. EOMI are intact. Mouth is well hydrated and without lesions. NECK: Supple. No masses LUNGS: Clear to auscultation. No presence of rhonchi/wheezing/rales. Adequate chest expansion HEART: RRR, normal s1 and s2. ABDOMEN: Soft, nontender, no guarding, no peritoneal signs, and nondistended. BS +. No masses. EXTREMITIES: Without any cyanosis, clubbing, rash, lesions or edema. NEUROLOGIC: AOx3, no focal motor deficit. SKIN: no jaundice, no rashes   Imaging/Labs: as above  I personally reviewed and interpreted the available labs, imaging and endoscopic files.  Impression and Plan: Brendan Ramirez is a 78  y.o. male with past medical history of hemorrhoids, COPD, hypertension, hyperlipidemia, OSA, prediabetes, who presents for evaluation of rectal bleeding.  The patient has presented intermittent issues with rectal bleeding and perianal discomfort in the setting of history of external hemorrhoids.  However, these symptoms have been intermittent and has not present any other red flag signs.  We discussed about importance of trying stool bulking agents to improve his bowel movement frequency and decrease straining, which he is in agreement to proceed with.  He may take some topical and hemorrhoidal medication if he presents any recurrent rectal bleeding issues.  He is due for colonoscopy given his history of colon polyps, which will be scheduled today.  -Schedule colonoscopy If presenting recurrent bleeding, can use topical Preparation H cream to improve the bleeding -Start Benefiber fiber supplements 1 to 2 tablespoons daily to increase stool bulk  All questions were answered.      Toribio Fortune, MD Gastroenterology and Hepatology [facility] Gastroenter[facility]

## 2024-06-13 NOTE — Telephone Encounter (Signed)
 Pt seen today and will have colonoscopy scheduled. He asked for the scheduler to call him after 3pm.

## 2024-06-13 NOTE — Telephone Encounter (Signed)
 noted

## 2024-06-13 NOTE — Patient Instructions (Addendum)
 Schedule colonoscopy If presenting recurrent bleeding, can use topical Preparation H cream to improve the bleeding Start Benefiber fiber supplements 1 to 2 tablespoons daily to increase stool bulk

## 2024-06-14 ENCOUNTER — Encounter: Payer: Self-pay | Admitting: *Deleted

## 2024-06-16 MED ORDER — NA SULFATE-K SULFATE-MG SULF 17.5-3.13-1.6 GM/177ML PO SOLN
1.0000 | Freq: Once | ORAL | 0 refills | Status: AC
Start: 2024-06-16 — End: 2024-06-16

## 2024-06-16 NOTE — Telephone Encounter (Signed)
 Pt called in and has been scheduled for 10/7. Aware will call back with pre-op appt. Rx for prep to be sent to pharmacy. Will mail instructions

## 2024-06-17 ENCOUNTER — Encounter: Payer: Self-pay | Admitting: *Deleted

## 2024-06-30 ENCOUNTER — Encounter (HOSPITAL_COMMUNITY)
Admission: RE | Admit: 2024-06-30 | Discharge: 2024-06-30 | Disposition: A | Discharge status: Home / Self Care | Source: Ambulatory Visit | Attending: Gastroenterology | Admitting: Gastroenterology

## 2024-06-30 ENCOUNTER — Encounter (HOSPITAL_COMMUNITY): Payer: Self-pay

## 2024-06-30 VITALS — Ht 71.0 in | Wt 295.4 lb

## 2024-06-30 DIAGNOSIS — I10 Essential (primary) hypertension: Secondary | ICD-10-CM

## 2024-06-30 DIAGNOSIS — Z79899 Other long term (current) drug therapy: Secondary | ICD-10-CM

## 2024-07-05 ENCOUNTER — Encounter (INDEPENDENT_AMBULATORY_CARE_PROVIDER_SITE_OTHER): Payer: Self-pay | Admitting: *Deleted

## 2024-07-05 ENCOUNTER — Ambulatory Visit (HOSPITAL_COMMUNITY)
Admission: RE | Admit: 2024-07-05 | Discharge: 2024-07-05 | Disposition: A | Discharge status: Home / Self Care | Attending: Gastroenterology | Admitting: Gastroenterology

## 2024-07-05 ENCOUNTER — Encounter (HOSPITAL_COMMUNITY)
Admission: RE | Disposition: A | Discharge status: Home / Self Care | Payer: Self-pay | Source: Home / Self Care | Attending: Gastroenterology

## 2024-07-05 ENCOUNTER — Other Ambulatory Visit: Payer: Self-pay

## 2024-07-05 ENCOUNTER — Ambulatory Visit (HOSPITAL_COMMUNITY)

## 2024-07-05 ENCOUNTER — Encounter (HOSPITAL_COMMUNITY): Payer: Self-pay | Admitting: Gastroenterology

## 2024-07-05 DIAGNOSIS — Z87891 Personal history of nicotine dependence: Secondary | ICD-10-CM | POA: Insufficient documentation

## 2024-07-05 DIAGNOSIS — I10 Essential (primary) hypertension: Secondary | ICD-10-CM

## 2024-07-05 DIAGNOSIS — Z1211 Encounter for screening for malignant neoplasm of colon: Secondary | ICD-10-CM | POA: Insufficient documentation

## 2024-07-05 DIAGNOSIS — K644 Residual hemorrhoidal skin tags: Secondary | ICD-10-CM | POA: Insufficient documentation

## 2024-07-05 DIAGNOSIS — Z860101 Personal history of adenomatous and serrated colon polyps: Secondary | ICD-10-CM

## 2024-07-05 DIAGNOSIS — K648 Other hemorrhoids: Secondary | ICD-10-CM | POA: Diagnosis not present

## 2024-07-05 DIAGNOSIS — G4730 Sleep apnea, unspecified: Secondary | ICD-10-CM | POA: Diagnosis not present

## 2024-07-05 DIAGNOSIS — K5730 Diverticulosis of large intestine without perforation or abscess without bleeding: Secondary | ICD-10-CM

## 2024-07-05 DIAGNOSIS — Z6841 Body Mass Index (BMI) 40.0 and over, adult: Secondary | ICD-10-CM | POA: Insufficient documentation

## 2024-07-05 DIAGNOSIS — Z860100 Personal history of colon polyps, unspecified: Secondary | ICD-10-CM

## 2024-07-05 DIAGNOSIS — D120 Benign neoplasm of cecum: Secondary | ICD-10-CM | POA: Insufficient documentation

## 2024-07-05 DIAGNOSIS — E66813 Obesity, class 3: Secondary | ICD-10-CM | POA: Diagnosis not present

## 2024-07-05 DIAGNOSIS — D122 Benign neoplasm of ascending colon: Secondary | ICD-10-CM

## 2024-07-05 DIAGNOSIS — J449 Chronic obstructive pulmonary disease, unspecified: Secondary | ICD-10-CM | POA: Insufficient documentation

## 2024-07-05 HISTORY — PX: COLONOSCOPY: SHX5424

## 2024-07-05 LAB — HM COLONOSCOPY

## 2024-07-05 SURGERY — COLONOSCOPY
Anesthesia: General

## 2024-07-05 MED ORDER — PROPOFOL 500 MG/50ML IV EMUL
INTRAVENOUS | Status: DC | PRN
  Administered 2024-07-05: 200 ug/kg/min via INTRAVENOUS

## 2024-07-05 MED ORDER — LACTATED RINGERS IV SOLN: INTRAVENOUS | Status: DC | PRN

## 2024-07-05 MED ORDER — GLYCOPYRROLATE PF 0.2 MG/ML IJ SOSY
PREFILLED_SYRINGE | INTRAMUSCULAR | Status: DC | PRN
  Administered 2024-07-05: .2 mg via INTRAVENOUS

## 2024-07-05 MED ORDER — PROPOFOL 10 MG/ML IV BOLUS
INTRAVENOUS | Status: DC | PRN
  Administered 2024-07-05: 100 mg via INTRAVENOUS

## 2024-07-05 NOTE — Interval H&P Note (Signed)
 History and Physical Interval Note:  07/05/2024 9:18 AM  Brendan Ramirez  has presented today for surgery, with the diagnosis of hx colon polyps.  The various methods of treatment have been discussed with the patient and family. After consideration of risks, benefits and other options for treatment, the patient has consented to  Procedure(s) with comments: COLONOSCOPY (N/A) - 10:30am, asa 3 as a surgical intervention.  The patient's history has been reviewed, patient examined, no change in status, stable for surgery.  I have reviewed the patient's chart and labs.  Questions were answered to the patient's satisfaction.     Lisaann Atha Castaneda Mayorga

## 2024-07-05 NOTE — Anesthesia Postprocedure Evaluation (Signed)
 Anesthesia Post Note  Patient: Brendan Ramirez  Procedure(s) Performed: COLONOSCOPY  Patient location during evaluation: PACU Anesthesia Type: General Level of consciousness: awake and alert Pain management: pain level controlled Vital Signs Assessment: post-procedure vital signs reviewed and stable Respiratory status: spontaneous breathing, nonlabored ventilation, respiratory function stable and patient connected to nasal cannula oxygen Cardiovascular status: blood pressure returned to baseline and stable Postop Assessment: no apparent nausea or vomiting Anesthetic complications: no   No notable events documented.   Last Vitals:  Vitals:   07/05/24 0849 07/05/24 1203  BP: 136/72 (!) 101/51  Pulse: 82 83  Resp: (!) 26 19  Temp: 37.4 C 37.2 C  SpO2: 95% 96%    Last Pain:  Vitals:   07/05/24 1203  TempSrc: Axillary  PainSc:                  Andrea Limes

## 2024-07-05 NOTE — Op Note (Signed)
 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 Estimated Blood Loss:     Estimated blood loss: none. Procedure:                Pre-Anesthesia Assessment:                           - Prior to the procedure, a History and Physical                            was performed, and patient medications, allergies                            and sensitivities were reviewed. The patient's                            tolerance of previous anesthesia was reviewed.                           - The risks and benefits of the procedure and the                            sedation options and risks were discussed with the                            patient. All questions were answered and informed  consent was obtained.                           - ASA Grade Assessment: II - A patient with mild                            systemic disease.                           After obtaining informed consent, the colonoscope                            was passed under direct vision. Throughout the                            procedure, the patient's blood pressure, pulse, and                             oxygen saturations were monitored continuously. The                            [id number]-[id number] ([id number]) Peds Colon was introduced                            through the anus and advanced to the the cecum,                            identified by appendiceal orifice and ileocecal                            valve. The colonoscopy was performed without                            difficulty. The patient tolerated the procedure                            well. The quality of the bowel preparation was good. Scope In: 11:34:00 AM Scope Out: 11:51:34 AM Scope Withdrawal Time: 0 hours 12 minutes 45 seconds  Total Procedure Duration: 0 hours 17 minutes 34 seconds  Findings:      Hemorrhoids were found on perianal exam.      An 8 mm polyp was found in the ascending colon. The polyp was sessile.       The polyp was removed with a cold snare. Resection and retrieval were       complete.      Scattered small-mouthed diverticula were found in the sigmoid colon.      External and internal hemorrhoids were found during retroflexion and       during perianal exam. The hemorrhoids were medium-sized. Impression:               - Hemorrhoids found on perianal exam.                           - One 8 mm polyp in the ascending colon, removed  with a cold snare. Resected and retrieved.                           - Diverticulosis in the sigmoid colon.                           - External and internal hemorrhoids. Moderate Sedation:      Per Anesthesia Care Recommendation:           - Discharge patient to home (ambulatory).                           - Resume previous diet.                           - Await pathology results.                           - Repeat colonoscopy for surveillance based on                            pathology results. Procedure Code(s):        --- Professional ---                           [phone], Colonoscopy, flexible; with removal of                             tumor(s), polyp(s), or other lesion(s) by snare                            technique Diagnosis Code(s):        --- Professional ---                           D12.2, Benign neoplasm of ascending colon                           K64.8, Other hemorrhoids                           Z86.010, Personal history of colonic polyps                           K57.30, Diverticulosis of large intestine without                            perforation or abscess without bleeding CPT copyright 2022 [employer]. All rights reserved. The codes documented in this report are preliminary and upon coder review may  be revised to meet current compliance requirements. Toribio Fortune, MD Toribio Fortune,  07/05/2024 12:01:41 PM This report has been signed electronically. Number of Addenda: 0

## 2024-07-05 NOTE — Transfer of Care (Signed)
 Immediate Anesthesia Transfer of Care Note  Patient: Brendan Ramirez  Procedure(s) Performed: COLONOSCOPY  Patient Location: Endoscopy Unit  Anesthesia Type:General  Level of Consciousness: awake, alert , oriented, and patient cooperative  Airway & Oxygen Therapy: Patient Spontanous Breathing  Post-op Assessment: Report given to RN, Post -op Vital signs reviewed and stable, and Patient moving all extremities X 4  Post vital signs: Reviewed and stable  Last Vitals:  Vitals Value Taken Time  BP    Temp    Pulse    Resp    SpO2      Last Pain:  Vitals:   07/05/24 0849  TempSrc: Oral  PainSc:          Complications: No notable events documented.

## 2024-07-05 NOTE — Discharge Instructions (Signed)
 You are being discharged to home.  Resume your previous diet.  We are waiting for your pathology results.  Your physician has recommended a repeat colonoscopy for surveillance based on pathology results.

## 2024-07-05 NOTE — Anesthesia Preprocedure Evaluation (Addendum)
 Anesthesia Evaluation  Patient identified by MRN, date of birth, ID band Patient awake    Reviewed: Allergy & Precautions, H&P , NPO status , Patient's Chart, lab work & pertinent test results  Airway Mallampati: II  TM Distance: >3 FB Neck ROM: Full    Dental no notable dental hx.    Pulmonary sleep apnea , COPD, former smoker   Pulmonary exam normal breath sounds clear to auscultation       Cardiovascular hypertension, Normal cardiovascular exam Rhythm:Regular Rate:Normal     Neuro/Psych negative neurological ROS  negative psych ROS   GI/Hepatic negative GI ROS, Neg liver ROS,,,  Endo/Other    Class 3 obesity  Renal/GU negative Renal ROS  negative genitourinary   Musculoskeletal negative musculoskeletal ROS (+)    Abdominal   Peds negative pediatric ROS (+)  Hematology negative hematology ROS (+)   Anesthesia Other Findings   Reproductive/Obstetrics negative OB ROS                              Anesthesia Physical Anesthesia Plan  ASA: 3  Anesthesia Plan: General   Post-op Pain Management:    Induction: Intravenous  PONV Risk Score and Plan:   Airway Management Planned: Nasal Cannula  Additional Equipment:   Intra-op Plan:   Post-operative Plan:   Informed Consent: I have reviewed the patients History and Physical, chart, labs and discussed the procedure including the risks, benefits and alternatives for the proposed anesthesia with the patient or authorized representative who has indicated his/her understanding and acceptance.     Dental advisory given  Plan Discussed with: CRNA  Anesthesia Plan Comments:          Anesthesia Quick Evaluation

## 2024-07-05 NOTE — OR Nursing (Signed)
 Glasses given to family member by patient.

## 2024-07-06 ENCOUNTER — Ambulatory Visit (INDEPENDENT_AMBULATORY_CARE_PROVIDER_SITE_OTHER): Payer: Self-pay | Admitting: Gastroenterology

## 2024-07-06 LAB — SURGICAL PATHOLOGY

## 2024-07-08 ENCOUNTER — Encounter (HOSPITAL_COMMUNITY): Payer: Self-pay | Admitting: Gastroenterology

## 2024-07-13 ENCOUNTER — Encounter (INDEPENDENT_AMBULATORY_CARE_PROVIDER_SITE_OTHER): Payer: Self-pay | Admitting: Gastroenterology

## 2024-07-15 NOTE — Progress Notes (Signed)
 5 yr TCS noted in recall Patient result letter mailed procedure note and pathology result faxed to PCP
